# Patient Record
Sex: Male | Born: 1960 | Race: White | Hispanic: No | Marital: Married | Smoking: Never smoker
Health system: Southern US, Community
[De-identification: ages and names within clinical notes are randomized; demographics above are authoritative.]

## PROBLEM LIST (undated history)

## (undated) DIAGNOSIS — K859 Acute pancreatitis without necrosis or infection, unspecified: Secondary | ICD-10-CM

## (undated) DIAGNOSIS — C679 Malignant neoplasm of bladder, unspecified: Secondary | ICD-10-CM

## (undated) DIAGNOSIS — K760 Fatty (change of) liver, not elsewhere classified: Secondary | ICD-10-CM

## (undated) DIAGNOSIS — T4145XA Adverse effect of unspecified anesthetic, initial encounter: Secondary | ICD-10-CM

## (undated) DIAGNOSIS — F909 Attention-deficit hyperactivity disorder, unspecified type: Secondary | ICD-10-CM

## (undated) DIAGNOSIS — J45909 Unspecified asthma, uncomplicated: Secondary | ICD-10-CM

## (undated) DIAGNOSIS — G4730 Sleep apnea, unspecified: Secondary | ICD-10-CM

## (undated) DIAGNOSIS — K219 Gastro-esophageal reflux disease without esophagitis: Secondary | ICD-10-CM

## (undated) DIAGNOSIS — K8020 Calculus of gallbladder without cholecystitis without obstruction: Secondary | ICD-10-CM

## (undated) DIAGNOSIS — T8859XA Other complications of anesthesia, initial encounter: Secondary | ICD-10-CM

## (undated) DIAGNOSIS — E7800 Pure hypercholesterolemia, unspecified: Secondary | ICD-10-CM

## (undated) HISTORY — DX: Malignant neoplasm of bladder, unspecified: C67.9

## (undated) HISTORY — PX: COLONOSCOPY: SHX174

## (undated) HISTORY — PX: BLADDER SURGERY: SHX569

## (undated) HISTORY — DX: Unspecified asthma, uncomplicated: J45.909

---
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
## 1998-10-02 HISTORY — PX: HERNIA REPAIR: SHX51

## 1999-08-02 ENCOUNTER — Ambulatory Visit (HOSPITAL_BASED_OUTPATIENT_CLINIC_OR_DEPARTMENT_OTHER): Admission: RE | Admit: 1999-08-02 | Discharge: 1999-08-02 | Payer: Self-pay | Admitting: *Deleted

## 2012-04-25 ENCOUNTER — Other Ambulatory Visit: Payer: Self-pay | Admitting: Obstetrics and Gynecology

## 2012-04-25 DIAGNOSIS — R7989 Other specified abnormal findings of blood chemistry: Secondary | ICD-10-CM

## 2012-05-07 ENCOUNTER — Ambulatory Visit
Admission: RE | Admit: 2012-05-07 | Discharge: 2012-05-07 | Disposition: A | Discharge status: Home / Self Care | Payer: BC Managed Care – PPO | Source: Ambulatory Visit | Attending: Obstetrics and Gynecology | Admitting: Obstetrics and Gynecology

## 2012-05-07 DIAGNOSIS — R7989 Other specified abnormal findings of blood chemistry: Secondary | ICD-10-CM

## 2012-05-07 MED ORDER — GADOBENATE DIMEGLUMINE 529 MG/ML IV SOLN
10.0000 mL | Freq: Once | INTRAVENOUS | Status: AC | PRN
  Administered 2012-05-07: 10 mL via INTRAVENOUS

## 2013-12-08 ENCOUNTER — Other Ambulatory Visit: Payer: BC Managed Care – PPO

## 2013-12-08 ENCOUNTER — Other Ambulatory Visit: Payer: Self-pay | Admitting: Family Medicine

## 2013-12-08 DIAGNOSIS — R109 Unspecified abdominal pain: Secondary | ICD-10-CM

## 2013-12-11 ENCOUNTER — Ambulatory Visit
Admission: RE | Admit: 2013-12-11 | Discharge: 2013-12-11 | Disposition: A | Discharge status: Home / Self Care | Payer: BC Managed Care – PPO | Source: Ambulatory Visit | Attending: Family Medicine | Admitting: Family Medicine

## 2013-12-11 DIAGNOSIS — R109 Unspecified abdominal pain: Secondary | ICD-10-CM

## 2015-10-03 DIAGNOSIS — I2584 Coronary atherosclerosis due to calcified coronary lesion: Secondary | ICD-10-CM

## 2015-10-03 DIAGNOSIS — I2510 Atherosclerotic heart disease of native coronary artery without angina pectoris: Secondary | ICD-10-CM

## 2015-10-03 HISTORY — DX: Coronary atherosclerosis due to calcified coronary lesion: I25.84

## 2015-10-03 HISTORY — DX: Atherosclerotic heart disease of native coronary artery without angina pectoris: I25.10

## 2016-05-11 ENCOUNTER — Emergency Department (HOSPITAL_COMMUNITY)
Admission: EM | Admit: 2016-05-11 | Discharge: 2016-05-12 | Disposition: A | Discharge status: Home / Self Care | Payer: BC Managed Care – PPO | Attending: Emergency Medicine | Admitting: Emergency Medicine

## 2016-05-11 ENCOUNTER — Encounter (HOSPITAL_COMMUNITY): Payer: Self-pay | Admitting: Emergency Medicine

## 2016-05-11 DIAGNOSIS — R319 Hematuria, unspecified: Secondary | ICD-10-CM

## 2016-05-11 DIAGNOSIS — Z79899 Other long term (current) drug therapy: Secondary | ICD-10-CM | POA: Insufficient documentation

## 2016-05-11 DIAGNOSIS — M545 Low back pain: Secondary | ICD-10-CM | POA: Diagnosis present

## 2016-05-11 HISTORY — DX: Sleep apnea, unspecified: G47.30

## 2016-05-11 HISTORY — DX: Pure hypercholesterolemia, unspecified: E78.00

## 2016-05-11 NOTE — ED Triage Notes (Signed)
Pt states about 2130 had a dark coca cola colored urination. Pt c/o back ache x 1 week. Denies pain in thighs or gluteus. Denies pain with urination .

## 2016-05-12 LAB — URINALYSIS, ROUTINE W REFLEX MICROSCOPIC
BILIRUBIN URINE: NEGATIVE
GLUCOSE, UA: NEGATIVE mg/dL
Ketones, ur: NEGATIVE mg/dL
Leukocytes, UA: NEGATIVE
Nitrite: NEGATIVE
PH: 5.5 (ref 5.0–8.0)
Protein, ur: NEGATIVE mg/dL
SPECIFIC GRAVITY, URINE: 1.011 (ref 1.005–1.030)

## 2016-05-12 LAB — URINE MICROSCOPIC-ADD ON: WBC, UA: NONE SEEN WBC/hpf (ref 0–5)

## 2016-05-12 NOTE — Discharge Instructions (Signed)
It is possible that your symptoms today may be due to a large kidney stone. This would also explain the sudden onset of your back pain which has lingered over the past few days. You have no evidence of bilirubin in your urine to suggest any liver problems. There is also no protein in your urine to suggest muscle breakdown or rhabdomyolysis. We recommend that you follow-up with your primary care doctor for blood work, if desired. Your primary doctor may also order an outpatient CT scan to evaluate for a potential kidney stone. Return to the emergency department for any new or concerning symptoms. You may take 500mg  tylenol or 600mg  ibuprofen every 6 hours for pain control, as needed.

## 2016-05-12 NOTE — ED Notes (Signed)
Pt requested to see PA.  PA aware.

## 2016-05-12 NOTE — ED Notes (Signed)
Pt ambulatory and independent at discharge.  Verbalized understanding of discharge instructions 

## 2016-05-12 NOTE — ED Notes (Signed)
Spoke with patient and wife about wait time

## 2016-05-12 NOTE — ED Provider Notes (Signed)
[facility] DEPT Provider Note   CSN: [id number] Arrival date & time: 05/11/16  2247  First Provider Contact:  None       History   Chief Complaint Chief Complaint  Patient presents with  . dark urination    HPI Sean Snow is a 55 y.o. male.  55 year old male with a history of dyslipidemia and sleep apnea presents to the emergency department for one episode of voiding urine of a darker color. Patient states that his urine appeared the same color as "Coca-Cola" this evening. Prior to this void, patient reports normal color to his urine. He states that he drank approximately 1.5 L of water following this episode and his subsequent void was, again, "straw-colored". Patient states that he is concerned about what may have caused this symptom. He does state that symptoms were preceded by low back pain. This began, initially, 6 days ago, waking the patient from sleep. He states that the pain has been constant and waxing and waning in severity. It was most severe on the first day. He denies a recent heavy lifting or other stress activity. No bowel or bladder incontinence or inability to ambulate. Patient has not had any significant nocturia or voiding difficulty. No hematuria that the patient has appreciated. No dysuria or fevers. Patient denies history of kidney stones. He has no known history of kidney stones in the family. Abdominal surgical history significant for a hernia repair.   The history is provided by the patient and the spouse. No language interpreter was used.    Past Medical History:  Diagnosis Date  . Hypercholesteremia   . Sleep apnea     There are no active problems to display for this patient.   Past Surgical History:  Procedure Laterality Date  . HERNIA REPAIR  2000   abdomen        Home Medications    Prior to Admission medications   Medication Sig Start Date End Date Taking? Authorizing Provider  acetaminophen (TYLENOL) 500 MG tablet Take 500 mg by  mouth every 6 (six) hours as needed for mild pain.   Yes Historical Provider, MD  albuterol (PROVENTIL HFA;VENTOLIN HFA) 108 (90 Base) MCG/ACT inhaler Inhale 1-2 puffs into the lungs every 6 (six) hours as needed for wheezing or shortness of breath.   Yes Historical Provider, MD  atorvastatin (LIPITOR) 10 MG tablet Take 1 tablet by mouth daily. 04/30/16  Yes Historical Provider, MD  ezetimibe (ZETIA) 10 MG tablet Take 1 tablet by mouth daily. 04/30/16  Yes Historical Provider, MD  ipratropium (ATROVENT) 0.06 % nasal spray Place 2 sprays into both nostrils daily as needed for rhinitis.   Yes Historical Provider, MD  MELATONIN PO Take 1 tablet by mouth at bedtime as needed (sleep).   Yes Historical Provider, MD  methylphenidate (RITALIN) 20 MG tablet Take 1 tablet by mouth 3 (three) times daily. 04/30/16  Yes Historical Provider, MD  montelukast (SINGULAIR) 10 MG tablet Take 1 tablet by mouth daily. 04/22/16  Yes Historical Provider, MD  Omega-3 Fatty Acids (FISH OIL PO) Take 1 tablet by mouth daily.   Yes Historical Provider, MD  testosterone cypionate (DEPOTESTOTERONE CYPIONATE) 100 MG/ML injection Inject 50 mg into the muscle every 7 (seven) days. For IM use only   Yes Historical Provider, MD  triamcinolone (NASACORT ALLERGY 24HR) 55 MCG/ACT AERO nasal inhaler Place 2 sprays into the nose daily.   Yes Historical Provider, MD    Family History No family history on file.  Social History  Social History  Substance Use Topics  . Smoking status: Never Smoker  . Smokeless tobacco: Never Used  . Alcohol use No     Allergies   Aspirin; Nsaids; Penicillins; and Sulfa antibiotics   Review of Systems Review of Systems  Constitutional: Negative for fever.  Gastrointestinal: Negative for nausea and vomiting.  Genitourinary: Negative for difficulty urinating and dysuria.       +dark urine -bowel/bladder incontinence  Musculoskeletal: Positive for back pain.  Ten systems reviewed and are negative  for acute change, except as noted in the HPI.    Physical Exam Updated Vital Signs BP 122/90 (BP Location: Left Arm)   Pulse 60   Temp 97.8 F (36.6 C)   Resp 18   SpO2 95%   Physical Exam  Constitutional: He is oriented to person, place, and time. He appears well-developed and well-nourished. No distress.  Nontoxic appearing. Patient in no distress.  HENT:  Head: Normocephalic and atraumatic.  Eyes: Conjunctivae and EOM are normal. No scleral icterus.  Neck: Normal range of motion.  Cardiovascular: Normal rate, regular rhythm and intact distal pulses.   Pulmonary/Chest: Effort normal. No respiratory distress. He has no wheezes. He has no rales.  Respirations even and unlabored.  Abdominal: Soft. He exhibits no distension and no mass. There is tenderness. There is no rebound.    Mild tenderness to palpation in the right lower abdomen. No masses, guarding, or peritoneal signs. No CVA tenderness bilaterally.  Musculoskeletal: Normal range of motion.  Neurological: He is alert and oriented to person, place, and time.  GCS 15. Patient ambulatory with steady gait.  Skin: Skin is warm and dry. No rash noted. He is not diaphoretic. No erythema. No pallor.  Psychiatric: He has a normal mood and affect. He is agitated.  Nursing note and vitals reviewed.    ED Treatments / Results  Labs (all labs ordered are listed, but only abnormal results are displayed) Labs Reviewed  URINALYSIS, ROUTINE W REFLEX MICROSCOPIC (NOT AT [facility]) - Abnormal; Notable for the following:       Result Value   APPearance CLOUDY (*)    Hgb urine dipstick LARGE (*)    All other components within normal limits  URINE MICROSCOPIC-ADD ON - Abnormal; Notable for the following:    Squamous Epithelial / LPF 0-5 (*)    Bacteria, UA RARE (*)    All other components within normal limits    EKG  EKG Interpretation None       Radiology No results found.  Procedures Procedures (including critical care  time)  Medications Ordered in ED Medications - No data to display   Initial Impression / Assessment and Plan / ED Course  I have reviewed the triage vital signs and the nursing notes.  Pertinent labs & imaging results that were available during my care of the patient were reviewed by me and considered in my medical decision making (see chart for details).  Clinical Course    55 year old male presents to the emergency department for changes to his urine color. This happened for 1 episode and resolved after oral fluid hydration. Patient has continued to void without difficulty. Symptoms have been preceded by 6 days of low back pain which were sudden in onset, waking the patient from sleep. Urinalysis performed which shows microscopic hematuria. In the setting of back pain, this raises the question for potential kidney stones. Patient is also concerned about potential liver etiology or rhabdomyolysis. I have reassured the patient that he  has no urobilinogen today to suggest any liver issues. He has no proteinuria to suggest rhabdomyolysis. Patient further denies being outdoors frequently or in any other setting which may have promoted dehydration.  Due to the unfortunate wait times, patient was very irritated by the time I evaluated him. I did offer to have labs performed as well as a CT scan to evaluate for a potential kidney stone. Patient declines further laboratory workup or imaging at this time as he states he can have this performed by his primary care doctor. Given that his pain seems fairly well controlled and his voiding trouble has resolved, I do not believe this is unreasonable. He exhibits no red flags or signs concerning for cauda equina. Patient has been instructed to return to the emergency department for worsening symptoms. Return precautions discussed and provided. Patient discharged in satisfactory condition with no unaddressed concerns.   Final Clinical Impressions(s) / ED  Diagnoses   Final diagnoses:  Hematuria  Low back pain without sciatica, unspecified back pain laterality    New Prescriptions Discharge Medication List as of 05/12/2016  3:54 AM       Antonietta Breach, PA-C 05/12/16 Home, MD 05/12/16 [phone]

## 2016-06-02 ENCOUNTER — Other Ambulatory Visit (HOSPITAL_COMMUNITY): Payer: Self-pay | Admitting: Internal Medicine

## 2016-06-02 ENCOUNTER — Ambulatory Visit (HOSPITAL_COMMUNITY)
Admission: RE | Admit: 2016-06-02 | Discharge: 2016-06-02 | Disposition: A | Discharge status: Home / Self Care | Payer: BC Managed Care – PPO | Source: Ambulatory Visit | Attending: Internal Medicine | Admitting: Internal Medicine

## 2016-06-02 DIAGNOSIS — N200 Calculus of kidney: Secondary | ICD-10-CM

## 2016-06-02 DIAGNOSIS — Z09 Encounter for follow-up examination after completed treatment for conditions other than malignant neoplasm: Secondary | ICD-10-CM | POA: Insufficient documentation

## 2016-06-02 DIAGNOSIS — I2510 Atherosclerotic heart disease of native coronary artery without angina pectoris: Secondary | ICD-10-CM | POA: Insufficient documentation

## 2016-06-02 DIAGNOSIS — I700 Atherosclerosis of aorta: Secondary | ICD-10-CM | POA: Diagnosis not present

## 2016-06-02 DIAGNOSIS — R9349 Abnormal radiologic findings on diagnostic imaging of other urinary organs: Secondary | ICD-10-CM | POA: Diagnosis not present

## 2016-06-02 DIAGNOSIS — K4090 Unilateral inguinal hernia, without obstruction or gangrene, not specified as recurrent: Secondary | ICD-10-CM | POA: Diagnosis not present

## 2016-06-02 DIAGNOSIS — Z87442 Personal history of urinary calculi: Secondary | ICD-10-CM | POA: Diagnosis not present

## 2016-08-02 NOTE — Progress Notes (Signed)
Cardiology Office Note   Date:  08/03/2016   ID:  Sean Snow, DOB 1960/11/26, MRN [id number]  PCP:  No PCP Per Patient  Cardiologist:   Minus Breeding, MD  Referring:  Self  Chief Complaint  Patient presents with  . Coronary Calcium      History of Present Illness: Sean Snow is a 55 y.o. male who presents for new patient evaluation. He's recently been found to have bladder cancer. As part of workup he has had CTs. He was found on a chest CT to have coronary calcium in all 3 vessels including the left main. However, he's never had any cardiovascular symptoms. I did take care of his father who had heart disease starting in his 58s and had an ischemic cardiomyopathy with an ICD. The patient is active. He exercises routinely.  The patient denies any new symptoms such as chest discomfort, neck or arm discomfort. There has been no new shortness of breath, PND or orthopnea. There have been no reported palpitations, presyncope or syncope.  He has had transurethral surgery for bladder cancer and is going to have this again with ongoing therapy.   Past Medical History:  Diagnosis Date  . Asthma    Exercise induced  . Bladder cancer ([place])   . Hypercholesteremia   . Sleep apnea    CPAP    Past Surgical History:  Procedure Laterality Date  . BLADDER SURGERY    . HERNIA REPAIR  2000   abdomen      Current Outpatient Prescriptions  Medication Sig Dispense Refill  . acetaminophen (TYLENOL) 500 MG tablet Take 500 mg by mouth every 6 (six) hours as needed for mild pain.    Marland Kitchen albuterol (PROVENTIL HFA;VENTOLIN HFA) 108 (90 Base) MCG/ACT inhaler Inhale 1-2 puffs into the lungs every 6 (six) hours as needed for wheezing or shortness of breath.    Marland Kitchen atorvastatin (LIPITOR) 10 MG tablet Take 1 tablet by mouth daily.    Marland Kitchen ezetimibe (ZETIA) 10 MG tablet Take 1 tablet by mouth daily.    Marland Kitchen ipratropium (ATROVENT) 0.06 % nasal spray Place 2 sprays into both nostrils daily as needed for  rhinitis.    Marland Kitchen MELATONIN PO Take 1 tablet by mouth at bedtime as needed (sleep).    . methylphenidate (RITALIN) 20 MG tablet Take 1 tablet by mouth 3 (three) times daily.    . montelukast (SINGULAIR) 10 MG tablet Take 1 tablet by mouth daily.    . Omega-3 Fatty Acids (FISH OIL PO) Take 1 tablet by mouth daily.    . propranolol (INDERAL) 20 MG tablet Take 1 tablet by mouth 3 (three) times daily as needed. For anxiety    . tamsulosin (FLOMAX) 0.4 MG CAPS capsule Take 0.4 mg by mouth 2 (two) times daily.    Marland Kitchen testosterone cypionate (DEPOTESTOTERONE CYPIONATE) 100 MG/ML injection Inject 50 mg into the muscle every 7 (seven) days. For IM use only    . triamcinolone (NASACORT ALLERGY 24HR) 55 MCG/ACT AERO nasal inhaler Place 2 sprays into the nose daily.     No current facility-administered medications for this visit.     Allergies:   Aspirin; Nsaids; Penicillins; and Sulfa antibiotics    Social History:  The patient  reports that he has never smoked. He has never used smokeless tobacco. He reports that he does not drink alcohol.   Family History:  The patient's family history includes Heart attack (age of onset: 71) in his father; Hyperlipidemia in his mother.  ROS:  Please see the history of present illness.   Otherwise, review of systems are positive for none.   All other systems are reviewed and negative.    PHYSICAL EXAM: VS:  BP 106/84   Pulse 75   Ht 5\' 10"  (1.778 m)   Wt 204 lb (92.5 kg)   BMI 29.27 kg/m  , BMI Body mass index is 29.27 kg/m. GENERAL:  Well appearing HEENT:  Pupils equal round and reactive, fundi not visualized, oral mucosa unremarkable NECK:  No jugular venous distention, waveform within normal limits, carotid upstroke brisk and symmetric, no bruits, no thyromegaly LYMPHATICS:  No cervical, inguinal adenopathy LUNGS:  Clear to auscultation bilaterally BACK:  No CVA tenderness CHEST:  Unremarkable HEART:  PMI not displaced or sustained,S1 and S2 within  normal limits, no S3, no S4, no clicks, no rubs, no murmurs ABD:  Flat, positive bowel sounds normal in frequency in pitch, no bruits, no rebound, no guarding, no midline pulsatile mass, no hepatomegaly, no splenomegaly EXT:  2 plus pulses throughout, no edema, no cyanosis no clubbing SKIN:  No rashes no nodules NEURO:  Cranial nerves II through XII grossly intact, motor grossly intact throughout PSYCH:  Cognitively intact, oriented to person place and time    EKG:  EKG is ordered today. The ekg ordered today demonstrates sinus rhythm, rate 75, axis within normal limits, intervals within normal limits, poor anterior R wave progression, no acute ST-T wave changes   Recent Labs: No results found for requested labs within last 8760 hours.    Lipid Panel No results found for: CHOL, TRIG, HDL, CHOLHDL, VLDL, LDLCALC, LDLDIRECT    Wt Readings from Last 3 Encounters:  08/03/16 204 lb (92.5 kg)      Other studies Reviewed: Additional studies/ records that were reviewed today include: CTs and [facility] records. Review of the above records demonstrates:  Please see elsewhere in the note.     ASSESSMENT AND PLAN:  CORONARY CALCIUM:  I will bring the patient back for a POET (Plain Old Exercise Test). This will allow me to screen for obstructive coronary disease, risk stratify and very importantly provide a prescription for exercise.  He has absolutely no symptoms and a very high functional level.  DYSLIPIDEMIA:   I have suggested moderate level statin which he was on before. He will go back to Crestor 20 mg. The goal should be an LDL of 70 given his family history calcification.   Current medicines are reviewed at length with the patient today.  The patient does not have concerns regarding medicines.  The following changes have been made:  As above  Labs/ tests ordered today include: POET (Plain Old Exercise Treadmill)  Orders Placed This Encounter  Procedures  . EXERCISE TOLERANCE TEST    . EKG 12-Lead     Disposition:   FU with me in one year.     Signed, Minus Breeding, MD  08/03/2016 11:08 AM    [place]

## 2016-08-03 ENCOUNTER — Ambulatory Visit (INDEPENDENT_AMBULATORY_CARE_PROVIDER_SITE_OTHER): Payer: BC Managed Care – PPO | Admitting: Cardiology

## 2016-08-03 ENCOUNTER — Encounter: Payer: Self-pay | Admitting: Cardiology

## 2016-08-03 ENCOUNTER — Encounter (INDEPENDENT_AMBULATORY_CARE_PROVIDER_SITE_OTHER): Payer: Self-pay

## 2016-08-03 VITALS — BP 106/84 | HR 75 | Ht 70.0 in | Wt 204.0 lb

## 2016-08-03 DIAGNOSIS — R0602 Shortness of breath: Secondary | ICD-10-CM

## 2016-08-03 DIAGNOSIS — R931 Abnormal findings on diagnostic imaging of heart and coronary circulation: Secondary | ICD-10-CM | POA: Diagnosis not present

## 2016-08-03 DIAGNOSIS — E785 Hyperlipidemia, unspecified: Secondary | ICD-10-CM

## 2016-08-03 NOTE — Patient Instructions (Signed)
Medication Instructions:  Continue current medication  Labwork: None Ordered  Testing/Procedures: Your physician has requested that you have an exercise tolerance test. For further information please visit [email or web address]. Please also follow instruction sheet, as given.   Follow-Up: Your physician wants you to follow-up in: 1 Year. You will receive a reminder letter in the mail two months in advance. If you don't receive a letter, please call our office to schedule the follow-up appointment.   Any Other Special Instructions Will Be Listed Below (If Applicable).     If you need a refill on your cardiac medications before your next appointment, please call your pharmacy.

## 2016-08-07 ENCOUNTER — Telehealth: Payer: Self-pay | Admitting: *Deleted

## 2016-08-07 MED ORDER — ROSUVASTATIN CALCIUM 20 MG PO TABS: 20.0000 mg | ORAL_TABLET | Freq: Every day | ORAL | 3 refills | Status: DC

## 2016-08-07 NOTE — Telephone Encounter (Signed)
OK per DPR to leave detailed msg. Call goes to VM, left instructions regarding med change, acknowledgment that it was sent to pharmacy, and advised to call if questions.

## 2016-08-07 NOTE — Telephone Encounter (Signed)
Patient called and stated that he saw Dr Percival Spanish last week and he was instructed to take crestor instead of lipitor, but an rx was not sent in. He would like this to be sent to [facility]. Thanks, MI

## 2016-08-31 ENCOUNTER — Ambulatory Visit: Payer: BC Managed Care – PPO | Admitting: Cardiology

## 2016-09-13 ENCOUNTER — Telehealth: Payer: Self-pay | Admitting: Cardiology

## 2016-09-13 DIAGNOSIS — E785 Hyperlipidemia, unspecified: Secondary | ICD-10-CM | POA: Insufficient documentation

## 2016-09-13 DIAGNOSIS — R931 Abnormal findings on diagnostic imaging of heart and coronary circulation: Secondary | ICD-10-CM | POA: Insufficient documentation

## 2016-09-13 DIAGNOSIS — R0602 Shortness of breath: Secondary | ICD-10-CM

## 2016-09-13 NOTE — Telephone Encounter (Signed)
Spoke to patient will have scheduler contact him on schedule GXT.

## 2016-09-13 NOTE — Telephone Encounter (Signed)
NEW MESSAGE    Pt verbalized that he received a call to schedule the Treadmill test but there is no order in the system  He had to finish with cancer treatments and test before he could have it done which is why he didn't schedule it before  I instructed pt that I will send RN a message the dates that he is looking for is 1/22-1/26 anytime after 8:30am   Please call pt

## 2016-09-14 ENCOUNTER — Encounter: Payer: Self-pay | Admitting: Cardiology

## 2016-10-20 ENCOUNTER — Telehealth (HOSPITAL_COMMUNITY): Payer: Self-pay

## 2016-10-20 NOTE — Telephone Encounter (Signed)
Encounter complete. 

## 2016-10-24 ENCOUNTER — Ambulatory Visit (HOSPITAL_COMMUNITY)
Admission: RE | Admit: 2016-10-24 | Discharge: 2016-10-24 | Disposition: A | Discharge status: Home / Self Care | Payer: BC Managed Care – PPO | Source: Ambulatory Visit | Attending: Cardiovascular Disease | Admitting: Cardiovascular Disease

## 2016-10-24 DIAGNOSIS — R0602 Shortness of breath: Secondary | ICD-10-CM

## 2016-10-24 DIAGNOSIS — E785 Hyperlipidemia, unspecified: Secondary | ICD-10-CM

## 2016-10-24 DIAGNOSIS — R931 Abnormal findings on diagnostic imaging of heart and coronary circulation: Secondary | ICD-10-CM

## 2016-10-24 LAB — EXERCISE TOLERANCE TEST
CHL CUP RESTING HR STRESS: 86 {beats}/min
CHL RATE OF PERCEIVED EXERTION: 17
CSEPED: 9 min
CSEPEW: 10.3 METS
CSEPPHR: 157 {beats}/min
Exercise duration (sec): 9 s
MPHR: 166 {beats}/min
Percent HR: 94 %

## 2017-09-05 ENCOUNTER — Other Ambulatory Visit: Payer: Self-pay | Admitting: Cardiology

## 2018-01-21 ENCOUNTER — Other Ambulatory Visit: Payer: Self-pay | Admitting: Cardiology

## 2018-01-22 NOTE — Telephone Encounter (Signed)
REFILL 

## 2018-02-26 ENCOUNTER — Encounter: Payer: Self-pay | Admitting: Physician Assistant

## 2018-02-26 ENCOUNTER — Ambulatory Visit: Payer: BC Managed Care – PPO | Admitting: Physician Assistant

## 2018-02-26 VITALS — BP 130/84 | HR 90 | Ht 70.0 in | Wt 210.6 lb

## 2018-02-26 DIAGNOSIS — E785 Hyperlipidemia, unspecified: Secondary | ICD-10-CM

## 2018-02-26 DIAGNOSIS — R931 Abnormal findings on diagnostic imaging of heart and coronary circulation: Secondary | ICD-10-CM | POA: Diagnosis not present

## 2018-02-26 DIAGNOSIS — R0602 Shortness of breath: Secondary | ICD-10-CM

## 2018-02-26 MED ORDER — ROSUVASTATIN CALCIUM 20 MG PO TABS: 20.0000 mg | ORAL_TABLET | Freq: Every day | ORAL | 4 refills | Status: DC

## 2018-02-26 NOTE — Progress Notes (Signed)
Cardiology Office Note   Date:  02/26/2018   ID:  Sean Snow, DOB 1961-05-30, MRN [id number]  PCP:  Deland Pretty, MD  Cardiologist: Dr. Percival Spanish, 08/03/2016 Rosaria Ferries, PA-C   Chief Complaint  Patient presents with  . Follow-up  . Shortness of Breath  . Chest Pain    History of Present Illness: Sean Snow is a 57 y.o. male with a history of bladder cancer, coronary calcification, FH CAD in father (46s) who had ICM/ICD. 08/2016 Office visit>>POET>>[facility] treadmill score 9, ECG no change  Sean Snow presents for cardiology follow up.  He has had surgery for the bladder CA. He did immunotherapy after the surgery and is now on maintenance therapy with the BCG. Biopsies have since been negative.   He was extremely deconditioned, but has started using a home treadmill. He feels he is starting to get his strength back. He is able to do 25" at 2.5 mph and was able to do an additional 45" later.  He feels his dyspnea on exertion is in line with his level of exertion.  He never gets chest pain with exertion.  He denies lower extremity edema, orthopnea, or PND.  He never gets palpitations.  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 He still takes the propranolol prn.  He has had chest pain and bilateral shoulder/arm pain. He does not get these sx with exertion, they are brought on by stress.  They are improved by him getting on the treadmill. He thinks the shoulder pain comes from him needing bilateral shoulder surgery.    Past Medical History:  Diagnosis Date  . Asthma    Exercise induced  . Bladder cancer ([place])   . Hypercholesteremia   . Sleep apnea    CPAP    Past Surgical History:  Procedure Laterality Date  . BLADDER SURGERY    . HERNIA REPAIR  2000   abdomen     Current Outpatient Medications  Medication Sig Dispense Refill  . acetaminophen (TYLENOL) 500 MG tablet Take 650 mg by mouth every 6 (six) hours as needed for mild pain.     Marland Kitchen albuterol (PROVENTIL  HFA;VENTOLIN HFA) 108 (90 Base) MCG/ACT inhaler Inhale 1-2 puffs into the lungs every 6 (six) hours as needed for wheezing or shortness of breath.    Marland Kitchen ipratropium (ATROVENT) 0.06 % nasal spray Place 2 sprays into both nostrils daily as needed for rhinitis.    Marland Kitchen MELATONIN PO Take 1 tablet by mouth at bedtime as needed (sleep).    . methylphenidate (RITALIN) 20 MG tablet Take 1 tablet by mouth 3 (three) times daily.    . NON FORMULARY CPAP    . Omega-3 Fatty Acids (FISH OIL PO) Take 1 tablet by mouth daily.    . propranolol (INDERAL) 20 MG tablet Take 1 tablet by mouth 3 (three) times daily as needed. For anxiety    . rosuvastatin (CRESTOR) 20 MG tablet Take 1 tablet (20 mg total) by mouth daily. KEEP OV. 90 tablet 0  . tamsulosin (FLOMAX) 0.4 MG CAPS capsule Take 0.4 mg by mouth 2 (two) times daily as needed.     . testosterone cypionate (DEPOTESTOTERONE CYPIONATE) 100 MG/ML injection Inject 50 mg into the muscle every 7 (seven) days. For IM use only    . triamcinolone (NASACORT ALLERGY 24HR) 55 MCG/ACT AERO nasal inhaler Place 2 sprays into the nose daily.     No current facility-administered medications for this visit.     Allergies:  Aspirin; Nsaids; Other; Penicillins; Tartrazine; Sulfamethoxazole; and Sulfa antibiotics    Social History:  The patient  reports that he has never smoked. He has never used smokeless tobacco. He reports that he does not drink alcohol.   Family History:  The patient's family history includes Heart attack (age of onset: 50) in his father; Hyperlipidemia in his mother.    ROS:  Please see the history of present illness. All other systems are reviewed and negative.    PHYSICAL EXAM: VS:  BP 130/84   Pulse 90   Ht 5\' 10"  (1.778 m)   Wt 210 lb 9.6 oz (95.5 kg)   BMI 30.22 kg/m  , BMI Body mass index is 30.22 kg/m. GEN: Well nourished, well developed, male in no acute distress  HEENT: normal for age  Neck: no JVD, no carotid bruit, no masses Cardiac:  RRR; no murmur, no rubs, or gallops Respiratory:  clear to auscultation bilaterally, normal work of breathing GI: soft, nontender, nondistended, + BS MS: no deformity or atrophy; no edema; distal pulses are 2+ in all 4 extremities   Skin: warm and dry, no rash Neuro:  Strength and sensation are intact Psych: euthymic mood, full affect   EKG:  EKG is ordered today. The ekg ordered today demonstrates SR, HR 90, no acute ischemic changes - no sig change from 2017   Recent Labs: No results found for requested labs within last 8760 hours.    Lipid Panel No results found for: CHOL, TRIG, HDL, CHOLHDL, VLDL, LDLCALC, LDLDIRECT   Wt Readings from Last 3 Encounters:  02/26/18 210 lb 9.6 oz (95.5 kg)  08/03/16 204 lb (92.5 kg)     Other studies Reviewed: Additional studies/ records that were reviewed today include: Office notes, hospital records and testing.  ASSESSMENT AND PLAN:  1.  Elevated coronary calcium score, family history of coronary artery disease: He is working to control his cardiac risk factors.  He is not over abusing caffeine, he does not smoke or abuse alcohol.  He is starting to exercise again, he has been a regular exerciser all of his life.  He is not having any ischemic symptoms.  Because of these issues, we will continue to follow him yearly, but do not need to do any additional testing at this time.  2.  Hyperlipidemia: Because of the coronary calcifications, his goal LDL is less than 70. PCP is following his labs, we will obtain copies.  Continue Crestor.   Current medicines are reviewed at length with the patient today.  The patient has concerns regarding medicines.  Concerns were addressed  The following changes have been made:  no change  Labs/ tests ordered today include:  No orders of the defined types were placed in this encounter.    Disposition:   FU with Dr. Percival Spanish  Signed, Rosaria Ferries, PA-C  02/26/2018 1:51 PM    [place] Phone: [phone]; Fax: [phone]  This note was written with the assistance of speech recognition software. Please excuse any transcriptional errors.

## 2018-02-26 NOTE — Patient Instructions (Signed)
Medication Instructions: Your physician recommends that you continue on your current medications as directed. Please refer to the Current Medication list given to you today.  If you need a refill on your cardiac medications before your next appointment, please call your pharmacy.    Follow-Up: Your physician wants you to follow-up in 1 year with Dr. Allena Napoleon will receive a reminder letter in the mail two months in advance. If you don't receive a letter, please call our office at [phone] to schedule this follow-up appointment.   Special Instructions: Continue with exercise regimen.   Thank you for choosing [facility] at [facility]!!

## 2018-05-23 ENCOUNTER — Encounter: Payer: Self-pay | Admitting: Internal Medicine

## 2018-11-09 ENCOUNTER — Emergency Department
Admission: EM | Admit: 2018-11-09 | Discharge: 2018-11-09 | Disposition: A | Discharge status: Home / Self Care | Payer: BC Managed Care – PPO | Source: Home / Self Care

## 2018-11-09 ENCOUNTER — Emergency Department (INDEPENDENT_AMBULATORY_CARE_PROVIDER_SITE_OTHER): Payer: BC Managed Care – PPO

## 2018-11-09 ENCOUNTER — Encounter: Payer: Self-pay | Admitting: Emergency Medicine

## 2018-11-09 DIAGNOSIS — S29012A Strain of muscle and tendon of back wall of thorax, initial encounter: Secondary | ICD-10-CM

## 2018-11-09 DIAGNOSIS — M549 Dorsalgia, unspecified: Secondary | ICD-10-CM

## 2018-11-09 DIAGNOSIS — M79602 Pain in left arm: Secondary | ICD-10-CM | POA: Diagnosis not present

## 2018-11-09 DIAGNOSIS — M25512 Pain in left shoulder: Secondary | ICD-10-CM | POA: Diagnosis not present

## 2018-11-09 MED ORDER — METAXALONE 800 MG PO TABS: 800.0000 mg | ORAL_TABLET | Freq: Three times a day (TID) | ORAL | 0 refills | Status: DC | PRN

## 2018-11-09 MED ORDER — PREDNISONE 50 MG PO TABS: 50.0000 mg | ORAL_TABLET | Freq: Every day | ORAL | 0 refills | Status: AC

## 2018-11-09 NOTE — Discharge Instructions (Signed)
°  Metaxalone (Skelaxin) is a muscle relaxer and may cause drowsiness. Do not drink alcohol, drive, or operate heavy machinery while taking.  Please follow up with your family doctor later this week if not improving.

## 2018-11-09 NOTE — ED Provider Notes (Signed)
[facility]    CSN: [id number] Arrival date & time: 11/09/18  1421     History   Chief Complaint Chief Complaint  Patient presents with  . Shoulder Pain    HPI Sean Snow is a 58 y.o. male.   HPI  Sean Snow is a 58 y.o. male presenting to UC with c/o gradually worsening Left upper back pain that radiates down his Left arm. Difficult to get into a comfortable position. Pt was gradually slowing down on a treadmill when it suddenly went from 16mph to 8mph, causing his feet to fly back behind him and his trunk.shoulders to lunge forward while he grasped tightly to the handle bars.  He did not fall. Pain gradually worsening since, 10/10 at worse, 7/10 at best. He has taken acetaminophen and used ice and heat w/o relief. Hx of angioedema with ibuprofen so he cannot take NSAIDs.  No prior hx of back or shoulder problems. Pt does report hx of bladder cancer but no known hx of bone mets. No hx of osteoporosis. Pt concerned about a slipped disc and is requesting imaging today.    Past Medical History:  Diagnosis Date  . Asthma    Exercise induced  . Bladder cancer ([place])   . Hypercholesteremia   . Sleep apnea    CPAP    Patient Active Problem List   Diagnosis Date Noted  . SOB (shortness of breath) 09/13/2016  . Elevated coronary artery calcium score 09/13/2016  . Dyslipidemia 09/13/2016    Past Surgical History:  Procedure Laterality Date  . BLADDER SURGERY    . HERNIA REPAIR  2000   abdomen        Home Medications    Prior to Admission medications   Medication Sig Start Date End Date Taking? Authorizing Provider  acetaminophen (TYLENOL) 500 MG tablet Take 650 mg by mouth every 6 (six) hours as needed for mild pain.     [provider]  albuterol (PROVENTIL HFA;VENTOLIN HFA) 108 (90 Base) MCG/ACT inhaler Inhale 1-2 puffs into the lungs every 6 (six) hours as needed for wheezing or shortness of breath.    [provider]  ipratropium  (ATROVENT) 0.06 % nasal spray Place 2 sprays into both nostrils daily as needed for rhinitis.    [provider]  MELATONIN PO Take 1 tablet by mouth at bedtime as needed (sleep).    [provider]  metaxalone (SKELAXIN) 800 MG tablet Take 1 tablet (800 mg total) by mouth 3 (three) times daily as needed for muscle spasms. 11/09/18   [name], PA-C  methylphenidate (RITALIN) 20 MG tablet Take 1 tablet by mouth 3 (three) times daily. 04/30/16   [provider]  NON FORMULARY CPAP    [provider]  Omega-3 Fatty Acids (FISH OIL PO) Take 1 tablet by mouth daily.    [provider]  predniSONE (DELTASONE) 50 MG tablet Take 1 tablet (50 mg total) by mouth daily with breakfast for 3 days. 11/09/18 11/12/18  [name], PA-C  propranolol (INDERAL) 20 MG tablet Take 1 tablet by mouth 3 (three) times daily as needed. For anxiety 07/03/16   [provider]  rosuvastatin (CRESTOR) 20 MG tablet Take 1 tablet (20 mg total) by mouth daily. KEEP OV. 02/26/18   Barrett, [name], PA-C  tamsulosin (FLOMAX) 0.4 MG CAPS capsule Take 0.4 mg by mouth 2 (two) times daily as needed.     [provider]  testosterone cypionate (DEPOTESTOTERONE  CYPIONATE) 100 MG/ML injection Inject 50 mg into the muscle every 7 (seven) days. For IM use only    [provider]  triamcinolone (NASACORT ALLERGY 24HR) 55 MCG/ACT AERO nasal inhaler Place 2 sprays into the nose daily.    [provider]    Family History Family History  Problem Relation Age of Onset  . Hyperlipidemia Mother   . Heart attack Father 47    Social History Social History   Tobacco Use  . Smoking status: Never Smoker  . Smokeless tobacco: Never Used  Substance Use Topics  . Alcohol use: No  . Drug use: Not on file     Allergies   Aspirin; Nsaids; Other; Penicillins; Tartrazine; Sulfamethoxazole; and Sulfa antibiotics   Review of Systems Review of Systems    Musculoskeletal: Positive for arthralgias, back pain and myalgias.  Neurological: Negative for weakness and numbness.     Physical Exam Triage Vital Signs ED Triage Vitals  Enc Vitals Group     BP 11/09/18 1443 (!) 165/111     Pulse Rate 11/09/18 1443 67     Resp --      Temp 11/09/18 1443 97.8 F (36.6 C)     Temp Source 11/09/18 1443 Oral     SpO2 11/09/18 1443 97 %     Weight 11/09/18 1445 224 lb 4 oz (101.7 kg)     Height 11/09/18 1445 5\' 10"  (1.778 m)     Head Circumference --      Peak Flow --      Pain Score 11/09/18 1444 7     Pain Loc --      Pain Edu? --      Excl. in [place]? --    No data found.  Updated Vital Signs BP (!) 165/111 (BP Location: Right Arm)   Pulse 67   Temp 97.8 F (36.6 C) (Oral)   Ht 5\' 10"  (1.778 m)   Wt 224 lb 4 oz (101.7 kg)   SpO2 97%   BMI 32.18 kg/m   Visual Acuity Right Eye Distance:   Left Eye Distance:   Bilateral Distance:    Right Eye Near:   Left Eye Near:    Bilateral Near:     Physical Exam Vitals signs and nursing note reviewed.  Constitutional:      Appearance: Normal appearance. He is well-developed.     Comments: Pt standing in exam room with Left arm above his head, trying to get into comfortable position, pacing at times. Alert and cooperative during exam.  HENT:     Head: Normocephalic and atraumatic.  Neck:     Musculoskeletal: Normal range of motion and neck supple. No muscular tenderness.     Comments: No midline bone tenderness, no crepitus or step-offs.  Cardiovascular:     Rate and Rhythm: Normal rate.     Pulses:          Radial pulses are 2+ on the left side.  Pulmonary:     Effort: Pulmonary effort is normal.  Musculoskeletal: Normal range of motion.        General: Tenderness present.       Back:     Comments: Tenderness to mid thoracic spine across Left upper back muscles, Left upper arm and Left forearm.  Full ROM Left arm w/o crepitus. 5/5 grip strength.   Skin:    General: Skin is warm  and dry.     Capillary Refill: Capillary refill takes less than 2 seconds.  Neurological:     Mental Status: He is alert and oriented to person, place, and time.  Psychiatric:        Behavior: Behavior normal.      UC Treatments / Results  Labs (all labs ordered are listed, but only abnormal results are displayed) Labs Reviewed - No data to display  EKG None  Radiology Dg Thoracic Spine W/swimmers  Result Date: 11/09/2018 CLINICAL DATA:  Pt states he fell off his treadmill on Wednesday and since then he has been having upper back pain that radiates to his left shoulder. EXAM: THORACIC SPINE - 3 VIEWS COMPARISON:  None. FINDINGS: There is no evidence of thoracic spine fracture. Alignment is normal. Mild degenerative changes are seen in the LOWER cervical spine. IMPRESSION: No evidence for acute abnormality. Electronically Signed   By: Nolon Nations M.D.   On: 11/09/2018 15:42   Dg Shoulder Left  Result Date: 11/09/2018 CLINICAL DATA:  Pt states he fell off his treadmill on Wednesday and since then he has been having left shoulder pain that radiates down his left arm. EXAM: LEFT SHOULDER - 2+ VIEW COMPARISON:  None. FINDINGS: There is no evidence of fracture or dislocation. There is no evidence of arthropathy or other focal bone abnormality. Soft tissues are unremarkable. IMPRESSION: Negative. Electronically Signed   By: Nolon Nations M.D.   On: 11/09/2018 15:42    Procedures Procedures (including critical care time)  Medications Ordered in UC Medications - No data to display  Initial Impression / Assessment and Plan / UC Course  I have reviewed the triage vital signs and the nursing notes.  Pertinent labs & imaging results that were available during my care of the patient were reviewed by me and considered in my medical decision making (see chart for details).     Advised pt fracture is not likely, however, due to hx of bladder cancer it is still a possibility.  Discussed  limitations of plain films Hx and exam most c/w muscle strain Encouraged conservative tx  Pt has had muscle relaxers before but does not recall having Flexeril, per Epic there may be a cross-reactivity reaction with tartrazine- per medical records pt had anaphylaxis.   Pt has had prednisone and skelaxin before and has done well.  F/u with PCP or Sports Medicine, additional imaging may be indicated if conservative tx not helping.   Final Clinical Impressions(s) / UC Diagnoses   Final diagnoses:  Left shoulder pain  Upper back pain on left side  Muscle strain of left upper back, initial encounter  Left arm pain     Discharge Instructions      Metaxalone (Skelaxin) is a muscle relaxer and may cause drowsiness. Do not drink alcohol, drive, or operate heavy machinery while taking.  Please follow up with your family doctor later this week if not improving.     ED Prescriptions    Medication Sig Dispense Auth. Provider   predniSONE (DELTASONE) 50 MG tablet Take 1 tablet (50 mg total) by mouth daily with breakfast for 3 days. 3 tablet [name], PA-C   metaxalone (SKELAXIN) 800 MG tablet Take 1 tablet (800 mg total) by mouth 3 (three) times daily as needed for muscle spasms. 21 tablet [name], PA-C     Controlled Substance Prescriptions [place] Controlled Substance Registry consulted? Yes, I have consulted the [place] Controlled Substances Registry for this patient, and feel the risk/benefit ratio today is favorable for proceeding with this prescription for a controlled substance.  [name], [place] 11/09/18 1625

## 2018-11-09 NOTE — ED Triage Notes (Signed)
Patient c/o left sided shoulder, back and arm injury from treadmill fall on Wednesday.

## 2018-12-02 MED ORDER — ONDANSETRON HCL 4 MG/2ML IJ SOLN
INTRAMUSCULAR | Status: AC
  Filled 2018-12-02: qty 2

## 2018-12-02 MED ORDER — LIDOCAINE 2% (20 MG/ML) 5 ML SYRINGE
INTRAMUSCULAR | Status: AC
  Filled 2018-12-02: qty 5

## 2018-12-02 MED ORDER — SODIUM CHLORIDE (PF) 0.9 % IJ SOLN
INTRAMUSCULAR | Status: AC
  Filled 2018-12-02: qty 10

## 2018-12-02 MED ORDER — SUGAMMADEX SODIUM 500 MG/5ML IV SOLN
INTRAVENOUS | Status: AC
  Filled 2018-12-02: qty 5

## 2018-12-02 MED ORDER — PROPOFOL 500 MG/50ML IV EMUL
INTRAVENOUS | Status: AC
  Filled 2018-12-02: qty 50

## 2018-12-02 MED ORDER — SUFENTANIL CITRATE 50 MCG/ML IV SOLN
INTRAVENOUS | Status: AC
  Filled 2018-12-02: qty 1

## 2018-12-02 MED ORDER — EPHEDRINE 5 MG/ML INJ
INTRAVENOUS | Status: AC
  Filled 2018-12-02: qty 10

## 2018-12-02 MED ORDER — DEXAMETHASONE SODIUM PHOSPHATE 10 MG/ML IJ SOLN
INTRAMUSCULAR | Status: AC
  Filled 2018-12-02: qty 1

## 2018-12-02 MED ORDER — MIDAZOLAM HCL 2 MG/2ML IJ SOLN
INTRAMUSCULAR | Status: AC
  Filled 2018-12-02: qty 2

## 2018-12-02 MED ORDER — ROCURONIUM BROMIDE 50 MG/5ML IV SOSY
PREFILLED_SYRINGE | INTRAVENOUS | Status: AC
  Filled 2018-12-02: qty 5

## 2018-12-02 MED ORDER — SUCCINYLCHOLINE CHLORIDE 200 MG/10ML IV SOSY
PREFILLED_SYRINGE | INTRAVENOUS | Status: AC
  Filled 2018-12-02: qty 10

## 2018-12-02 MED ORDER — PHENYLEPHRINE 40 MCG/ML (10ML) SYRINGE FOR IV PUSH (FOR BLOOD PRESSURE SUPPORT)
PREFILLED_SYRINGE | INTRAVENOUS | Status: AC
  Filled 2018-12-02: qty 10

## 2019-05-04 ENCOUNTER — Other Ambulatory Visit: Payer: Self-pay | Admitting: Physician Assistant

## 2019-06-06 ENCOUNTER — Telehealth: Payer: Self-pay | Admitting: Cardiology

## 2019-06-06 MED ORDER — ROSUVASTATIN CALCIUM 20 MG PO TABS: 20.0000 mg | ORAL_TABLET | Freq: Every day | ORAL | 0 refills | Status: DC

## 2019-06-06 NOTE — Telephone Encounter (Signed)
Requested Prescriptions   Signed Prescriptions Disp Refills  . rosuvastatin (CRESTOR) 20 MG tablet 30 tablet 0    Sig: Take 1 tablet (20 mg total) by mouth daily.    Authorizing Provider: Lonn Georgia    Ordering User: Raelene Bott, Malaijah Houchen L

## 2019-06-06 NOTE — Telephone Encounter (Signed)
New message    *STAT* If patient is at the pharmacy, call can be transferred to refill team.   1. Which medications need to be refilled? (please list name of each medication and dose if known)   rosuvastatin (CRESTOR) 20 MG tablet     2. Which pharmacy/location (including street and city if local pharmacy) is medication to be sent to?[place], [place]  3. Do they need a 30 day or 90 day supply?[facility]

## 2019-06-17 ENCOUNTER — Ambulatory Visit: Payer: BC Managed Care – PPO | Admitting: Physician Assistant

## 2019-06-17 ENCOUNTER — Encounter: Payer: Self-pay | Admitting: Physician Assistant

## 2019-06-17 ENCOUNTER — Other Ambulatory Visit: Payer: Self-pay

## 2019-06-17 VITALS — BP 148/92 | HR 65 | Ht 70.0 in | Wt 238.0 lb

## 2019-06-17 DIAGNOSIS — R931 Abnormal findings on diagnostic imaging of heart and coronary circulation: Secondary | ICD-10-CM | POA: Diagnosis not present

## 2019-06-17 DIAGNOSIS — E785 Hyperlipidemia, unspecified: Secondary | ICD-10-CM | POA: Diagnosis not present

## 2019-06-17 MED ORDER — ROSUVASTATIN CALCIUM 20 MG PO TABS: 20.0000 mg | ORAL_TABLET | Freq: Every day | ORAL | 3 refills | Status: DC

## 2019-06-17 NOTE — Progress Notes (Signed)
Cardiology Office Note   Date:  06/17/2019   ID:  Sean Snow, DOB 28-Aug-1961, MRN [id number]  PCP:  Deland Pretty, MD Cardiologist:  Minus Breeding, MD 08/13/2016 Rosaria Ferries, PA-C 02/26/2018   History of Present Illness: Sean Snow is a 58 y.o. male with a history of recurrent bladder cancer, coronary calcification, FH CAD in father (54s) who had ICM/ICD. 08/2016 Office visit>>POET>>Duke treadmill score 9, ECG no change.   Sean Snow presents for cardiology follow up.  His bladder CA (2017), keeps coming back. He has had multiple surgeries. Just tx care to another specialist 09/04. Has had tumors removed 6 times and other areas biopsied. Had a granuloma felt 2nd BCG>>cannot have any more. One surgery was a week before procedures were canceled due to COVID.   In Feb, was on the treadmill when it shorted out and caused him to get upper body injury, neck and back. Took 3 months to improve. Gabapentin helped, nothing else did.   He has not been able to exercise because of his injuries. 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   His back and neck have improved to the point that he can be a little more active, but it has been slow. Has not yet fixed the treadmill because he cannot use his upper body well enough. Was able to do 4000 steps one day recently. No chest pain or SOB w/ exertion.   Still is getting referred pain into his lower back, which may be from his injuries, but he also gets when he has bladder tumors. Dislikes the gabapentin but it helps.   Does not wake with lower extremity edema.  No orthopnea or PND.    Past Medical History:  Diagnosis Date  . Asthma    Exercise induced  . Bladder cancer ([place])   . Hypercholesteremia   . Sleep apnea    CPAP    Past Surgical History:  Procedure Laterality Date  . BLADDER SURGERY    . HERNIA REPAIR  2000   abdomen     Current Outpatient Medications  Medication Sig Dispense Refill   . acetaminophen (TYLENOL) 500 MG tablet Take 650 mg by mouth every 6 (six) hours as needed for mild pain.     Marland Kitchen albuterol (PROVENTIL HFA;VENTOLIN HFA) 108 (90 Base) MCG/ACT inhaler Inhale 1-2 puffs into the lungs every 6 (six) hours as needed for wheezing or shortness of breath.    . ezetimibe (ZETIA) 10 MG tablet Take 1 tablet by mouth at bedtime.    . gabapentin (NEURONTIN) 600 MG tablet Take 1 tablet by mouth as directed.    Marland Kitchen ipratropium (ATROVENT) 0.06 % nasal spray Place 2 sprays into both nostrils daily as needed for rhinitis.    Marland Kitchen MELATONIN PO Take 1 tablet by mouth at bedtime as needed (sleep).    . metaxalone (SKELAXIN) 800 MG tablet Take 1 tablet (800 mg total) by mouth 3 (three) times daily as needed for muscle spasms. 21 tablet 0  . methylphenidate (RITALIN) 20 MG tablet Take 1 tablet by mouth 3 (three) times daily.    . NON FORMULARY CPAP    . Omega-3 Fatty Acids (FISH OIL PO) Take 1 tablet by mouth daily.    . propranolol (INDERAL) 20 MG tablet Take 1 tablet by mouth 3 (three) times daily as needed. For anxiety    . rosuvastatin (CRESTOR) 20 MG tablet Take 1 tablet (20 mg total) by mouth daily. 30 tablet 0  .  tamsulosin (FLOMAX) 0.4 MG CAPS capsule Take 0.4 mg by mouth 2 (two) times daily as needed.     . testosterone cypionate (DEPOTESTOTERONE CYPIONATE) 100 MG/ML injection Inject 50 mg into the muscle every 7 (seven) days. For IM use only    . triamcinolone (NASACORT ALLERGY 24HR) 55 MCG/ACT AERO nasal inhaler Place 2 sprays into the nose daily.     No current facility-administered medications for this visit.     Allergies:   Aspirin, Nsaids, Other, Penicillins, Tartrazine, Sulfamethoxazole, and Sulfa antibiotics    Social History:  The patient  reports that he has never smoked. He has never used smokeless tobacco. He reports that he does not drink alcohol.   Family History:  The patient's family history includes Heart attack (age of onset: 53) in his father; Hyperlipidemia  in his mother.  He indicated that his mother is alive. He indicated that his father is deceased.   ROS:  Please see the history of present illness. All other systems are reviewed and negative.    PHYSICAL EXAM: VS:  BP (!) 148/92   Pulse 65   Ht 5\' 10"  (1.778 m)   Wt 238 lb (108 kg)   BMI 34.15 kg/m  , BMI Body mass index is 34.15 kg/m. GEN: Well nourished, well developed, male in no acute distress HEENT: normal for age  Neck: no JVD, no carotid bruit, no masses Cardiac: RRR; no murmur, no rubs, or gallops Respiratory:  clear to auscultation bilaterally, normal work of breathing GI: soft, nontender, nondistended, + BS MS: no deformity or atrophy; no edema; distal pulses are 2+ in all 4 extremities  Skin: warm and dry, no rash Neuro:  Strength and sensation are intact Psych: euthymic mood, full affect   EKG:  EKG is ordered today. The ekg ordered today demonstrates sinus rhythm with artifact, heart rate 65, no acute ischemic changes, Q waves seen in 3 and aVF are old  ETT:  10/24/2016  Blood pressure demonstrated a normal response to exercise.  There was no ST segment deviation noted during stress.   ETT with normal exercise tolerance (9:09); no chest pain; no ST changes; normal BP response; negative adequate ETT; Duke treadmill score 9.   Recent Labs: No results found for requested labs within last 8760 hours.  CBC No results found for: WBC, RBC, HGB, HCT, PLT, MCV, MCH, MCHC, RDW, LYMPHSABS, MONOABS, EOSABS, BASOSABS No flowsheet data found.   Lipid Panel No results found for: CHOL, HDL, LDLCALC, LDLDIRECT, TRIG, CHOLHDL    Wt Readings from Last 3 Encounters:  06/17/19 238 lb (108 kg)  11/09/18 224 lb 4 oz (101.7 kg)  02/26/18 210 lb 9.6 oz (95.5 kg)     Other studies Reviewed: Additional studies/ records that were reviewed today include: Office notes, hospital records and testing  ASSESSMENT AND PLAN:  1.  Coronary calcifications: - Until his recent  injury and multiple procedures, his fitness level was very good. -He is not having any ischemic symptoms at all. - He is encouraged to increase his activity back to previous levels, continue to be aggressive with this. -Report any ischemic symptoms -At this time, no need for additional testing.  2. Hyperlipidemia: -With his strong family history for premature coronary artery disease, would like to keep his LDL less than 70 and his LDL greater than 39 - Dr. Shelia Media follows his labs, will obtain copies - Refill his rosuvastatin for 90 days with refills as requested   Current medicines are reviewed at  length with the patient today.  The patient has concerns regarding medicines.  Concerns were addressed  The following changes have been made:  no change  Labs/ tests ordered today include:  No orders of the defined types were placed in this encounter.    Disposition:   FU with Minus Breeding, MD  Signed, Rosaria Ferries, PA-C  06/17/2019 1:51 PM    [place] Phone: [phone]; Fax: [phone]

## 2019-06-17 NOTE — Patient Instructions (Signed)
Medication Instructions:  Your physician recommends that you continue on your current medications as directed. Please refer to the Current Medication list given to you today.  If you need a refill on your cardiac medications before your next appointment, please call your pharmacy.    Follow-Up: At [facility], you and your health needs are our priority.  As part of our continuing mission to provide you with exceptional heart care, we have created designated Provider Care Teams.  These Care Teams include your primary Cardiologist (physician) and Advanced Practice Providers (APPs -  Physician Assistants and Nurse Practitioners) who all work together to provide you with the care you need, when you need it. You will need a follow up appointment in 1 year.  Please call our office 2 months in advance to schedule this appointment.  You may see Minus Breeding, MD or one of the following Advanced Practice Providers on your designated Care Team:   Rosaria Ferries, PA-C . Jory Sims, DNP, ANP

## 2019-06-20 NOTE — Addendum Note (Signed)
Addended by: Diana Eves on: 06/20/2019 03:57 PM   Modules accepted: Orders

## 2019-10-08 ENCOUNTER — Other Ambulatory Visit: Payer: Self-pay | Admitting: Internal Medicine

## 2019-10-08 ENCOUNTER — Other Ambulatory Visit (HOSPITAL_COMMUNITY): Payer: Self-pay | Admitting: Internal Medicine

## 2019-10-08 ENCOUNTER — Other Ambulatory Visit: Payer: Self-pay

## 2019-10-08 ENCOUNTER — Ambulatory Visit (HOSPITAL_COMMUNITY)
Admission: RE | Admit: 2019-10-08 | Discharge: 2019-10-08 | Disposition: A | Discharge status: Home / Self Care | Payer: BC Managed Care – PPO | Source: Ambulatory Visit | Attending: Internal Medicine | Admitting: Internal Medicine

## 2019-10-08 DIAGNOSIS — R1031 Right lower quadrant pain: Secondary | ICD-10-CM | POA: Insufficient documentation

## 2019-10-08 MED ORDER — IOHEXOL 300 MG/ML  SOLN
100.0000 mL | Freq: Once | INTRAMUSCULAR | Status: AC | PRN
  Administered 2019-10-08: 100 mL via INTRAVENOUS

## 2020-02-09 ENCOUNTER — Other Ambulatory Visit: Payer: Self-pay | Admitting: Surgery

## 2020-02-18 NOTE — Pre-Procedure Instructions (Signed)
[place], [place] [address] Phone: [phone] Fax: [phone]      Your procedure is scheduled on June 1st, 2021 Tuesday.  Report to [facility] Main Entrance "A" at 5:30 A.M., and check in at the Admitting office.  Call this number if you have problems the morning of surgery:  [phone]  Call [phone] if you have any questions prior to your surgery date Monday-Friday 8am-4pm    Remember:  Do not eat after midnight the night before your surgery  You may drink clear liquids until 4:30am the morning of your surgery.   Clear liquids allowed are: Water, Non-Citrus Juices (without pulp), Carbonated Beverages, Clear Tea, Black Coffee Only, and Gatorade    Take these medicines the morning of surgery with A SIP OF WATER   albuterol inhaler  gabapentin (NEURONTIN) 600 MG tablet  metaxalone (SKELAXIN) 800 MG tablet  rosuvastatin (CRESTOR) 20 MG tablet  triamcinolone (NASACORT ALLERGY 24HR) 55 MCG/ACT AERO nasal inhaler  tamsulosin (FLOMAX) 0.4 MG CAPS capsule   methylphenidate (RITALIN) 20 MG tablet   Take these medicines as needed  acetaminophen (TYLENOL) 500 MG tablet  For pain  propranolol (INDERAL) 20 MG tablet as needed for anxiety  As of today, STOP taking any Aspirin (unless otherwise instructed by your surgeon) and Aspirin containing products, Aleve, Naproxen, Ibuprofen, Motrin, Advil, Goody's, BC's, all herbal medications, fish oil, and all vitamins.                      Do not wear jewelry, make up, or nail polish            Do not wear lotions, powders, perfumes/colognes, or deodorant.            Do not shave 48 hours prior to surgery.  Men may shave face and neck.            Do not bring valuables to the hospital.            [facility] is not responsible for any belongings or valuables.  Do NOT Smoke (Tobacco/Vapping) or drink Alcohol 24 hours prior to your procedure If  you use a CPAP at night, you may bring all equipment for your overnight stay.   Contacts, glasses, dentures or bridgework may not be worn into surgery.      For patients admitted to the hospital, discharge time will be determined by your treatment team.   Patients discharged the day of surgery will not be allowed to drive home, and someone needs to stay with them for 24 hours.    Special instructions:   [place]- Preparing For Surgery  Before surgery, you can play an important role. Because skin is not sterile, your skin needs to be as free of germs as possible. You can reduce the number of germs on your skin by washing with CHG (chlorahexidine gluconate) Soap before surgery.  CHG is an antiseptic cleaner which kills germs and bonds with the skin to continue killing germs even after washing.    Oral Hygiene is also important to reduce your risk of infection.  Remember - BRUSH YOUR TEETH THE MORNING OF SURGERY WITH YOUR REGULAR TOOTHPASTE  Please do not use if you have an allergy to CHG or antibacterial soaps. If your skin becomes reddened/irritated stop using the CHG.  Do not shave (including legs and underarms) for at least 48 hours prior to  first CHG shower. It is OK to shave your face.  Please follow these instructions carefully.   1. Shower the NIGHT BEFORE SURGERY and the MORNING OF SURGERY with CHG Soap.   2. If you chose to wash your hair, wash your hair first as usual with your normal shampoo.  3. After you shampoo, rinse your hair and body thoroughly to remove the shampoo.  4. Use CHG as you would any other liquid soap. You can apply CHG directly to the skin and wash gently with a scrungie or a clean washcloth.   5. Apply the CHG Soap to your body ONLY FROM THE NECK DOWN.  Do not use on open wounds or open sores. Avoid contact with your eyes, ears, mouth and genitals (private parts). Wash Face and genitals (private parts)  with your normal soap.   6. Wash thoroughly,  paying special attention to the area where your surgery will be performed.  7. Thoroughly rinse your body with warm water from the neck down.  8. DO NOT shower/wash with your normal soap after using and rinsing off the CHG Soap.  9. Pat yourself dry with a CLEAN TOWEL.  10. Wear CLEAN PAJAMAS to bed the night before surgery, wear comfortable clothes the morning of surgery  11. Place CLEAN SHEETS on your bed the night of your first shower and DO NOT SLEEP WITH PETS.   Day of Surgery:   Do not apply any deodorants/lotions.  Please wear clean clothes to the hospital/surgery center.   Remember to brush your teeth WITH YOUR REGULAR TOOTHPASTE.   Please read over the following fact sheets that you were given.

## 2020-02-19 ENCOUNTER — Encounter (HOSPITAL_COMMUNITY): Payer: Self-pay

## 2020-02-19 ENCOUNTER — Encounter (HOSPITAL_COMMUNITY)
Admission: RE | Admit: 2020-02-19 | Discharge: 2020-02-19 | Disposition: A | Discharge status: Home / Self Care | Payer: BC Managed Care – PPO | Source: Ambulatory Visit | Attending: Surgery | Admitting: Surgery

## 2020-02-19 ENCOUNTER — Other Ambulatory Visit: Payer: Self-pay

## 2020-02-19 DIAGNOSIS — Z01812 Encounter for preprocedural laboratory examination: Secondary | ICD-10-CM | POA: Insufficient documentation

## 2020-02-19 HISTORY — DX: Fatty (change of) liver, not elsewhere classified: K76.0

## 2020-02-19 HISTORY — DX: Calculus of gallbladder without cholecystitis without obstruction: K80.20

## 2020-02-19 HISTORY — DX: Gastro-esophageal reflux disease without esophagitis: K21.9

## 2020-02-19 HISTORY — DX: Attention-deficit hyperactivity disorder, unspecified type: F90.9

## 2020-02-19 HISTORY — DX: Acute pancreatitis without necrosis or infection, unspecified: K85.90

## 2020-02-19 HISTORY — DX: Other complications of anesthesia, initial encounter: T88.59XA

## 2020-02-19 LAB — COMPREHENSIVE METABOLIC PANEL
ALT: 28 U/L (ref 0–44)
AST: 22 U/L (ref 15–41)
Albumin: 4.2 g/dL (ref 3.5–5.0)
Alkaline Phosphatase: 39 U/L (ref 38–126)
Anion gap: 10 (ref 5–15)
BUN: 13 mg/dL (ref 6–20)
CO2: 23 mmol/L (ref 22–32)
Calcium: 9.3 mg/dL (ref 8.9–10.3)
Chloride: 105 mmol/L (ref 98–111)
Creatinine, Ser: 1.23 mg/dL (ref 0.61–1.24)
GFR calc Af Amer: >60 mL/min (ref >60)
GFR calc non Af Amer: >60 mL/min (ref >60)
Glucose, Bld: 143 mg/dL — ABNORMAL HIGH (ref 70–99)
Potassium: 4 mmol/L (ref 3.5–5.1)
Sodium: 138 mmol/L (ref 135–145)
Total Bilirubin: 0.4 mg/dL (ref 0.3–1.2)
Total Protein: 7 g/dL (ref 6.5–8.1)

## 2020-02-19 LAB — CBC
HCT: 50.3 % (ref 39.0–52.0)
Hemoglobin: 16.1 g/dL (ref 13.0–17.0)
MCH: 29.9 pg (ref 26.0–34.0)
MCHC: 32 g/dL (ref 30.0–36.0)
MCV: 93.5 fL (ref 80.0–100.0)
Platelets: 254 10*3/uL (ref 150–400)
RBC: 5.38 MIL/uL (ref 4.22–5.81)
RDW: 14 % (ref 11.5–15.5)
WBC: 8.4 10*3/uL (ref 4.0–10.5)
nRBC: 0 % (ref 0.0–0.2)

## 2020-02-19 NOTE — Pre-Procedure Instructions (Addendum)
Sean Snow  02/19/2020    Your procedure is scheduled on Tuesday, March 02, 2020 at 7:30 AM.   Report to [facility] Entrance "A" Admitting Office at 5:30 AM.   Call this number if you have problems the morning of surgery: [phone]   Questions prior to day of surgery, please call [phone] between 8 & 4 PM.   Remember:  Do not eat food after midnight Monday, 03/01/20.  You may drink clear liquids until 4:30 AM.  Clear liquids allowed are: Water, Juice (non-citric and without pulp - diabetics please choose diet or no sugar options), Carbonated beverages - (diabetics please choose diet or no sugar options), Clear Tea, Black Coffee only (no creamer, milk or cream including half and half) and Gatorade (diabetics please choose diet or no sugar options)   Drink the Pre-Surgery Ensure between 4:15 AM and 4:30 AM the morning of surgery. This will be the last liquids that you will have prior to surgery.   Take these medicines the morning of surgery with A SIP OF WATER: Gabapentin (Neurontin), Fexofenadine(Allegra),  Tamsulosin (Flomax) - if needed, Propanolol (Inderal) - if needed,  Acetaminophen (Tylenol) - if needed, Nasocort nasal spray,  Ipratropium (Atrovent) nasal spray - if needed, Xopenex inhaler - if needed (bring inhaler with you day of surgery)  Stop Herbal medications (Melatonin), Multivitamins and Fish Oil 7 days prior to surgery. Do not use Aspirin products (Goody's, BC Powders, etc) or NSAIDS (Ibuprofen, Aleve, etc) 7 days prior to surgery.  Please call the pharmacy [phone] to have them review your medications    Do not wear jewelry.  Do not wear lotions, powders, cologne or deodorant.  Men may shave face and neck.  Do not bring valuables to the hospital.  [facility] is not responsible for any belongings or valuables.  Contacts, dentures or bridgework may not be worn into surgery.  Leave your suitcase in the car.  After surgery it may be brought to your  room.  For patients admitted to the hospital, discharge time will be determined by your treatment team.  Patients discharged the day of surgery will not be allowed to drive home.   [facility] - Preparing for Surgery  Before surgery, you can play an important role.  Because skin is not sterile, your skin needs to be as free of germs as possible.  You can reduce the number of germs on you skin by washing with CHG (chlorahexidine gluconate) soap before surgery.  CHG is an antiseptic cleaner which kills germs and bonds with the skin to continue killing germs even after washing.  Oral Hygiene is also important in reducing the risk of infection.  Remember to brush your teeth with your regular toothpaste the morning of surgery.  Please DO NOT use if you have an allergy to CHG or antibacterial soaps.  If your skin becomes reddened/irritated stop using the CHG and inform your nurse when you arrive at Short Stay.  Do not shave (including legs and underarms) for at least 48 hours prior to the first CHG shower.  You may shave your face.  Please follow these instructions carefully:   1.  Shower with CHG Soap the night before surgery and the morning of Surgery.  2.  If you choose to wash your hair, wash your hair first as usual with your normal shampoo.  3.  After you shampoo, rinse your hair and body thoroughly to remove the shampoo. 4.  Use CHG as you would any other  liquid soap.  You can apply chg directly to the skin and wash gently with a      scrungie or washcloth.           5.  Apply the CHG Soap to your body ONLY FROM THE NECK DOWN.   Do not use on open wounds or open sores. Avoid contact with your eyes, ears, mouth and genitals (private parts).  Wash genitals (private parts) with your normal soap - do this prior to using CHG soap.  6.  Wash thoroughly, paying special attention to the area where your surgery will be performed.  7.  Thoroughly rinse your body with warm water from the neck down.  8.   DO NOT shower/wash with your normal soap after using and rinsing off the CHG Soap.  9.  Pat yourself dry with a clean towel.            10.  Wear clean pajamas.            11.  Place clean sheets on your bed the night of your first shower and do not sleep with pets.  Day of Surgery  Shower as above. Do not apply any lotions/deodorants the morning of surgery.   Please wear clean clothes to the hospital. Remember to brush your teeth with toothpaste.  Please read over the fact sheets that you were given.

## 2020-02-19 NOTE — Progress Notes (Signed)
PCP - Dr. Shelia Media Cardiologist - Dr. Percival Spanish  EKG - 06/17/19 ECHO - 10/24/16  Sleep Study - not sure when CPAP - yes  ERAS Protcol - yes PRE-SURGERY Ensure - yes  COVID TEST- scheduled for 02/27/20  Anesthesia review: yes - heart history Pt is concerned that he has always had trouble urinating after anesthesia. He states he has always gone home with a catheter and wants to make sure he does with this surgery.  Patient denies shortness of breath, fever, cough and chest pain at PAT appointment   All instructions explained to the patient, with a verbal understanding of the material. Patient agrees to go over the instructions while at home for a better understanding. Patient also instructed to self quarantine after being tested for COVID-19. The opportunity to ask questions was provided.

## 2020-02-20 ENCOUNTER — Encounter (HOSPITAL_COMMUNITY): Payer: Self-pay | Admitting: Vascular Surgery

## 2020-02-20 NOTE — Anesthesia Preprocedure Evaluation (Deleted)
Anesthesia Evaluation Anesthesia Physical Anesthesia Plan  ASA:   Anesthesia Plan:    Post-op Pain Management:    Induction:   PONV Risk Score and Plan:   Airway Management Planned:   Additional Equipment:   Intra-op Plan:   Post-operative Plan:   Informed Consent:   Plan Discussed with:   Anesthesia Plan Comments: (See APP note by A. Vyctoria Dickman, FNP )        Anesthesia Quick Evaluation  

## 2020-02-20 NOTE — Progress Notes (Addendum)
Anesthesia Chart Review:  Case: [id number] Date/Time: 03/02/20 0715   Procedure: APPENDECTOMY LAPAROSCOPIC (N/A )   Anesthesia type: General   Pre-op diagnosis: FECALITH OF APPENDIX   Location: [place] OR ROOM 05 / [place] [facility]   Surgeons: Coralie Keens, MD      DISCUSSION: Patient is a 59 year old male scheduled for the above procedure.  History includes never smoker, CAD (coronary calcifications seen on CT during bladder cancer evaluation ~ 2018), OSA (CPAP), hypercholesterolemia, GERD, pancreatitis (history of), fatty liver (on CT), recurrent bladder cancer (diagnosed 2017, s/p multiple TURBT, last 05/28/19; s/p immunotherapy), ADHD, exercise-induced asthma, right inguinal hernia repair (2000). BMI is consistent with obesity. History of angioedema with aspirin.  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  He reported that he has difficulty urinating after anesthesia and has required home foley catheter at discharge several times in the past.  **Pt requests the urinary catheter be left in place and he will remove it at home after discharge.  He reports leaving urinary catheter in place after anesthesia has been necessary after his most recent 7 procedures (related to bladder cancer treatment).  He states he is competent to d/c foley at home.   - Pt has trouble with opioids impacting bladder function after anesthesia.  He prefers not to be given tramadol (had poor experience with it).  He reports his pain has been successfully treated for pain post-op with tylenol and gabapentin   Last cardiology evaluation on 06/17/2019 by Rosaria Ferries, PA-C.  No ischemic symptoms at that time.  No additional testing recommended.  1 year follow-up planned.  BP at PAT was 135/99. He is not on any antihypertensives (propranolol is 3 times daily as needed). BP 148/92 at 06/17/19 cardiology visit and 143/93 at 10/17/19 urology visit. He is on medication for ADHD (no longer on Ritalin but on dextroamphetamine 2 tablets every  morning, noon, and at 4 PM) which could be contributing. Ideally, would like to see if he is monitoring BP at home, and if numbers are consistently elevated than notify Dr. Ninfa Linden and have patient communicate with his PCP.  Will leave chart for anesthesia APP to follow-up next week.   Preoperative COVID-19 test is scheduled for 02/27/2020.    VS: BP (!) 135/99   Pulse 86   Temp 37.1 C (Oral)   Resp 18   Ht 5\' 7"  (1.702 m)   Wt 94.2 kg   SpO2 99%   BMI 32.51 kg/m  It does not look like BP was rechecked at PAT.   PROVIDERS: Deland Pretty, MD his PCP Minus Breeding, MD is cardiologist Roxanne Mins, MD is urologist ([place]). Last evaluation 10/17/2019, unscheduled visit at request of PCP for RLQ symptomology. CT scan obtained with stable GU findings without evidence of urolithiasis, but dilated appendix, so referred to GI specialist.    LABS: Labs reviewed: Acceptable for surgery. A1c 6.4% 09/12/19 (Dr. Shelia Media) (all labs ordered are listed, but only abnormal results are displayed)  Labs Reviewed  COMPREHENSIVE METABOLIC PANEL - Abnormal; Notable for the following components:      Result Value   Glucose, Bld 143 (*)    All other components within normal limits  CBC    IMAGES: CT Abd/pelvis 10/08/19: IMPRESSION: 1. Dilated appendix without classic signs of appendicitis. The patient may be at risk for developing appendicitis due to stool like material distending the lumen. Appearance is unchanged based on comparison with exam from December and contrast passes into the appendix and  around inspissated material within the lumen. 2. Mild asymmetric perinephric stranding is also stable. Correlation with urine studies could potentially be of benefit to exclude mild UTI. 3. Moderate fat containing left inguinal hernia. 4. Possible background hepatic steatosis. 5. Aortic atherosclerosis. 6. Signs of right inguinal herniorrhaphy.  CTChest 04/30/17 [facility]): Result  Impression: 1. No findings of primary or metastatic disease involving the thorax. Specifically, I see no pathology to correlate with the suspicious finding on the patient's March 06, 2017 chest radiograph. 2. A moderate/heavy burden of calcified atherosclerotic disease is evident within the coronary arterial distribution.   EKG: 06/17/19 ([facility]):  Sinus Rhyhm Inferior infarct, age undetermined (Q wave in III and aVF are old) Artifact and baseline wanderer   CV: ETT 10/24/16:  Blood pressure demonstrated a normal response to exercise.  There was no ST segment deviation noted during stress. ETT with normal exercise tolerance (9:09); no chest pain; no ST changes; normal BP response; negative adequate ETT; Duke treadmill score 9.   Past Medical History:  Diagnosis Date  . ADHD   . Asthma    Exercise induced  . Bladder cancer ([place])   . Complication of anesthesia    trouble urinating after surgery, had to go home with a catheter every time, needs as little of opiates that can be given. cannot take Tramadol  . Coronary artery calcification 2017  . Fatty liver    found on CT scan per pt  . Gallstones   . GERD (gastroesophageal reflux disease)    in the past  . Hypercholesteremia   . Pancreatitis   . Sleep apnea    CPAP    Past Surgical History:  Procedure Laterality Date  . BLADDER SURGERY    . COLONOSCOPY    . HERNIA REPAIR  2000   inguinal on right    MEDICATIONS: . Coenzyme Q10 (CO Q-10) 300 MG CAPS  . dextroamphetamine (DEXTROSTAT) 10 MG tablet  . fexofenadine (ALLEGRA) 180 MG tablet  . levalbuterol (XOPENEX HFA) 45 MCG/ACT inhaler  . Multiple Vitamin (MULTIVITAMINS PO)  . acetaminophen (TYLENOL) 500 MG tablet  . albuterol (PROVENTIL HFA;VENTOLIN HFA) 108 (90 Base) MCG/ACT inhaler  . ezetimibe (ZETIA) 10 MG tablet  . gabapentin (NEURONTIN) 600 MG tablet  . ipratropium (ATROVENT) 0.06 % nasal spray  . MELATONIN PO  . metaxalone (SKELAXIN) 800 MG tablet  .  methylphenidate (RITALIN) 20 MG tablet  . NON FORMULARY  . Omega-3 Fatty Acids (FISH OIL PO)  . propranolol (INDERAL) 20 MG tablet  . rosuvastatin (CRESTOR) 20 MG tablet  . tamsulosin (FLOMAX) 0.4 MG CAPS capsule  . testosterone cypionate (DEPOTESTOTERONE CYPIONATE) 100 MG/ML injection  . triamcinolone (NASACORT ALLERGY 24HR) 55 MCG/ACT AERO nasal inhaler   No current facility-administered medications for this encounter.  Per med list, he is currently holding fish oil for surgery; no longer taking Skelaxin, albuterol, or Ritalin; taking gabapentin only as needed; has not required melatonin on in a "long time"; and takes propranolol 3 times daily as needed (anxiety).   Myra Gianotti, PA-C Surgical Short Stay/Anesthesiology [facility] Phone [phone] [facility] Phone [phone] 02/21/2020 10:40 AM   Addendum 02/26/20:   I have left messages for pt to call me back regarding his home monitoring of BP (if any) but pt has not responded.  I left a message for Dr. Ninfa Linden with triage nurse Kennyth Lose regarding elevated diastolic BP.   Willeen Cass, FNP-BC [facility] Short Stay Surgical Center/Anesthesiology Phone: [phone] 02/26/2020 3:26 PM   Addendum 02/27/20:  Pt returned my call.  He does not check his blood pressure at home stating he has medical care appointments frequently and his BP is checked there.  He reports feeling anxious about surgery at his pre-admission testing appointment and his diastolic BP is not usually that high.     If no changes, I anticipate pt can proceed with surgery as scheduled.   Willeen Cass, FNP-BC [facility] Short Stay Surgical Center/Anesthesiology Phone: [phone] 02/27/2020 9:12 AM

## 2020-02-27 ENCOUNTER — Other Ambulatory Visit (HOSPITAL_COMMUNITY)
Admission: RE | Admit: 2020-02-27 | Discharge: 2020-02-27 | Disposition: A | Discharge status: Home / Self Care | Payer: BC Managed Care – PPO | Source: Ambulatory Visit | Attending: Surgery | Admitting: Surgery

## 2020-02-27 DIAGNOSIS — U071 COVID-19: Secondary | ICD-10-CM | POA: Insufficient documentation

## 2020-02-28 LAB — SARS CORONAVIRUS 2 (TAT 6-24 HRS): SARS Coronavirus 2: POSITIVE — AB

## 2020-02-28 NOTE — Progress Notes (Signed)
Dr. Deland Pretty called back and informed of the + covid result for this pt.

## 2020-02-28 NOTE — Progress Notes (Addendum)
Spoke to Dr. Dema Severin who advised case was going to be cancelled. Dr. Dema Severin states he has been unable to get in contact with patient with multiple attempts. I called patient's phone number and wife's phone number as well. I left message and was unable to contact. Spoke to Mudlogger who requested I contact patient. I contacted patient and advised him that surgery was cancelled per Dr. Dema Severin due to lab results (ok to give results per L. Paxton/ Dr. Dema Severin). Patient requested that Dr. Dema Severin contact him. I relayed the message to Dr. Dema Severin.

## 2020-02-28 NOTE — Progress Notes (Signed)
Sean Snow from the on-call service for Dr. Evlyn Courier with + covid results for this pt collected on Fri 5/28 for his procedure on 6/1 at [facility] Or at [place].   These are the current guidelines:  Positive Results for:  Asymptomatic: Procedure postponed and quarantine for 10 days. Symptomatic: Procedure postponed and quarantine for 14 days. Immunocompromised: Procedure postponed and quarantine for 20 days.  The pt will not be retested for 90 days from the + result.

## 2020-02-29 ENCOUNTER — Telehealth: Payer: Self-pay | Admitting: Physician Assistant

## 2020-02-29 ENCOUNTER — Telehealth: Payer: Self-pay | Admitting: Nurse Practitioner

## 2020-02-29 ENCOUNTER — Telehealth: Payer: Self-pay | Admitting: Adult Health

## 2020-02-29 NOTE — Telephone Encounter (Signed)
LMOM about COVID 19 monoclonal antibody treatment for COVID 19.  Asked that he call us back.    Sean Bihari, NP

## 2020-02-29 NOTE — Telephone Encounter (Signed)
Called to discuss with patient about Covid symptoms and the use of bamlanivimab/etesevimab or casirivimab/imdevimab, a monoclonal antibody infusion for those with mild to moderate Covid symptoms and at a high risk of hospitalization.  Pt is qualified for this infusion at the [facility] infusion center due to immuno-compromised ( bladder cancer) and BMI>25  Message left to call back  Angelena Form PA-C  [facility]

## 2020-02-29 NOTE — Telephone Encounter (Signed)
Called to discuss with Sean Snow about Covid symptoms and the use of bamlanivimab/etesevimab or casirivimab/imdevimab, a monoclonal antibody infusion for those with mild to moderate Covid symptoms and at a high risk of hospitalization.     Pt is qualified for this infusion at the [facility] infusion center due to co-morbid conditions and/or a member of an at-risk group, however declines infusion at this time. Symptoms tier reviewed as well as criteria for ending isolation.  Symptoms reviewed that would warrant ED/Hospital evaluation. Preventative practices reviewed. Patient verbalized understanding. Patient advised to call back if he decides that he does want to get infusion. Callback number to the infusion center given. Patient advised to go to Urgent care or ED with severe symptoms. Last date pt would be eligible for infusion is 03/08/20.   Pt would like to think about it and get back to [place].    Patient Active Problem List   Diagnosis Date Noted  . SOB (shortness of breath) 09/13/2016  . Elevated coronary artery calcium score 09/13/2016  . Dyslipidemia 09/13/2016    Angelena Form PA-C

## 2020-02-29 NOTE — Telephone Encounter (Signed)
Called to discuss with Sabino Dick about Covid symptoms and the use of  bamlanivimab/etesevimab or casirivimab/imdevimab, a combination monoclonal antibody infusion for those with mild to moderate Covid symptoms and at a high risk of hospitalization.     Pt is qualified for this infusion at the [facility] infusion center due to co-morbid conditions (BMI >25, immunocompromised).   Unable to reach. Voicemail left.   Patient Active Problem List   Diagnosis Date Noted  . SOB (shortness of breath) 09/13/2016  . Elevated coronary artery calcium score 09/13/2016  . Dyslipidemia 09/13/2016    Alda Lea, AGPCNP-BC Pager: [phone] Amion: Bjorn Pippin

## 2020-03-02 ENCOUNTER — Encounter (HOSPITAL_COMMUNITY): Admission: RE | Payer: Self-pay | Source: Home / Self Care

## 2020-03-02 ENCOUNTER — Ambulatory Visit (HOSPITAL_COMMUNITY): Admission: RE | Admit: 2020-03-02 | Payer: BC Managed Care – PPO | Source: Home / Self Care | Admitting: Surgery

## 2020-03-02 ENCOUNTER — Encounter (HOSPITAL_COMMUNITY): Payer: Self-pay | Admitting: Vascular Surgery

## 2020-03-02 SURGERY — APPENDECTOMY, LAPAROSCOPIC
Anesthesia: General

## 2020-04-21 ENCOUNTER — Ambulatory Visit: Admit: 2020-04-21 | Payer: BC Managed Care – PPO | Admitting: Surgery

## 2020-04-21 SURGERY — APPENDECTOMY, LAPAROSCOPIC
Anesthesia: General

## 2020-07-07 IMAGING — CT CT ABD-PELV W/ CM
2 of 4 series · 15 of 46 positions shown, 17 images · IV contrast (Omni 300)
Comparison: Comparison made over Gadi Dadon system from
09/09/2019

CLINICAL DATA: History of bladder cancer with abdominal pain.

EXAM:
CT ABDOMEN AND PELVIS WITH CONTRAST
TECHNIQUE: Multidetector CT imaging of the abdomen and pelvis was performed
using the standard protocol following bolus administration of
intravenous contrast.
CONTRAST:  100mL OMNIPAQUE IOHEXOL 300 MG/ML  SOLN

[Series 3: a/p w/ 5mm · axial · 0.77mm/px · z∈[+717,+1192]mm · 12 of 105 slices shown, 14 images]
[im 5/105  soft-tissue]
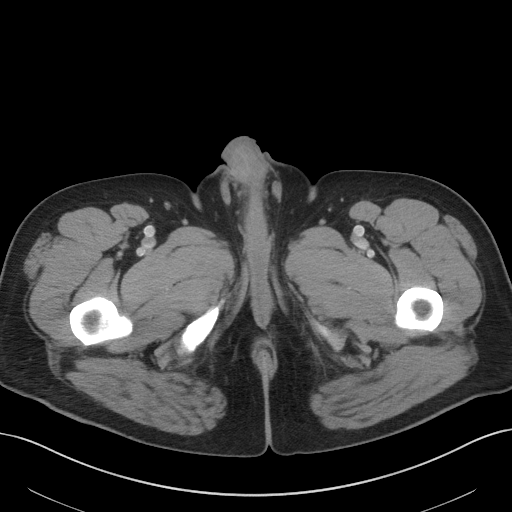
[im 5/105  bone]
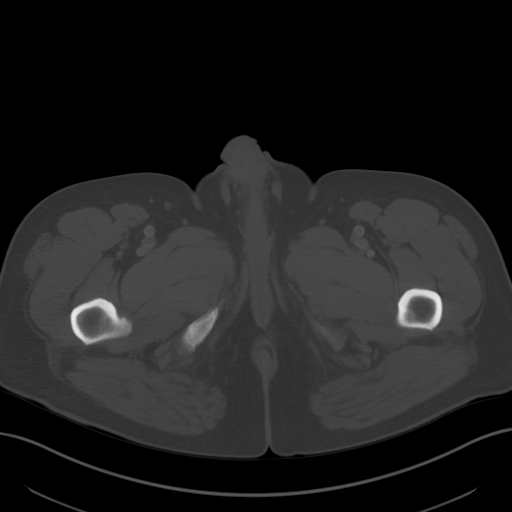
[im 14/105  soft-tissue]
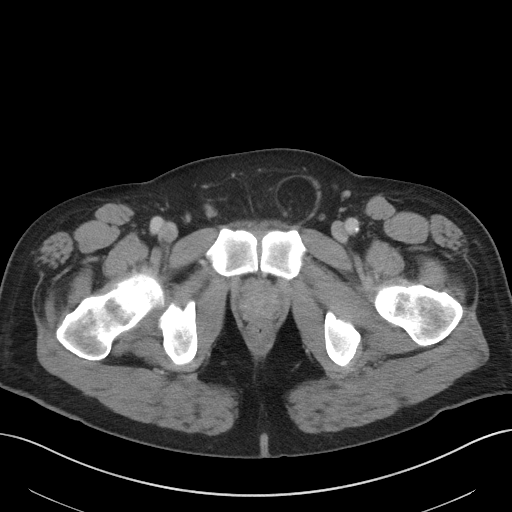
[im 22/105  soft-tissue]
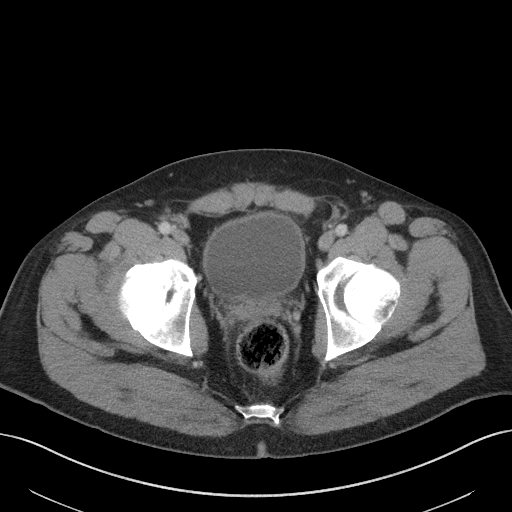
[im 31/105  soft-tissue]
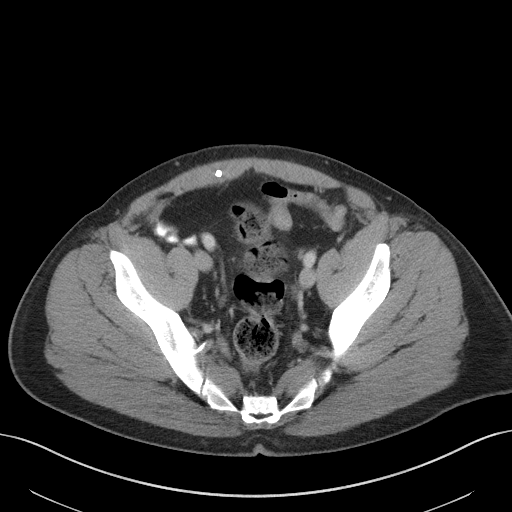
[im 40/105  soft-tissue]
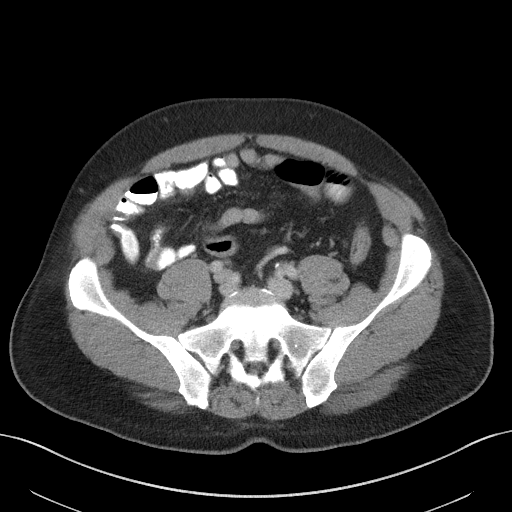
[im 48/105  soft-tissue]
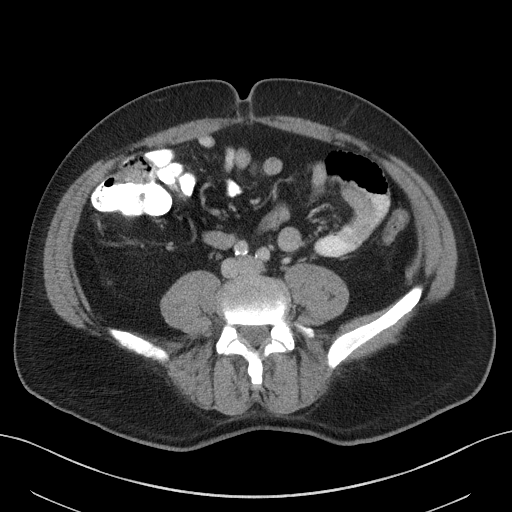
[im 57/105  soft-tissue]
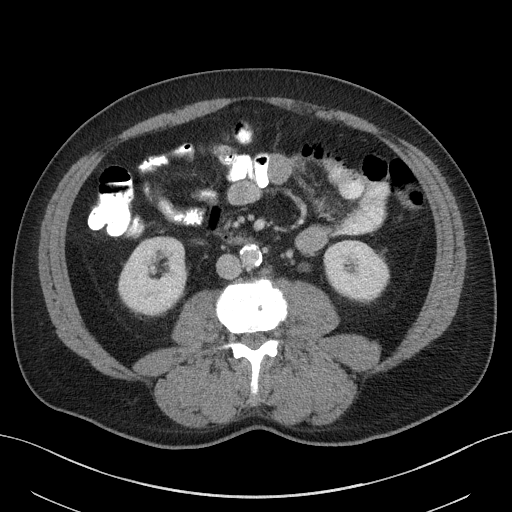
[im 66/105  soft-tissue]
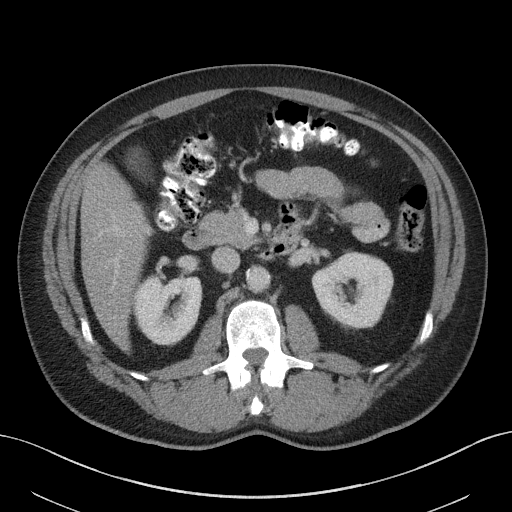
[im 74/105  soft-tissue]
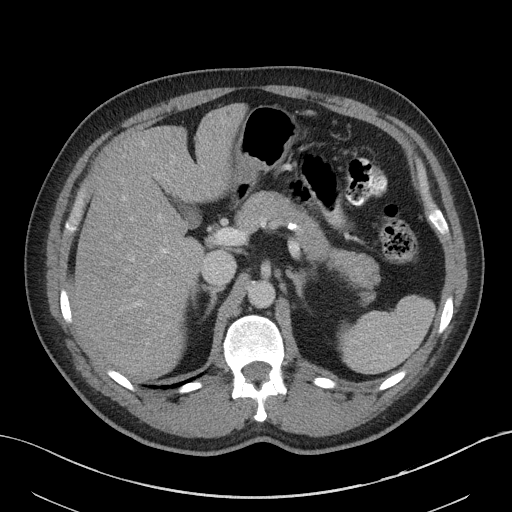
[im 74/105  bone]
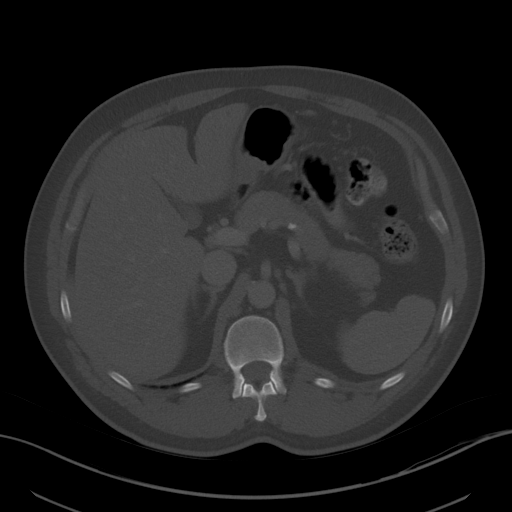
[im 83/105  soft-tissue]
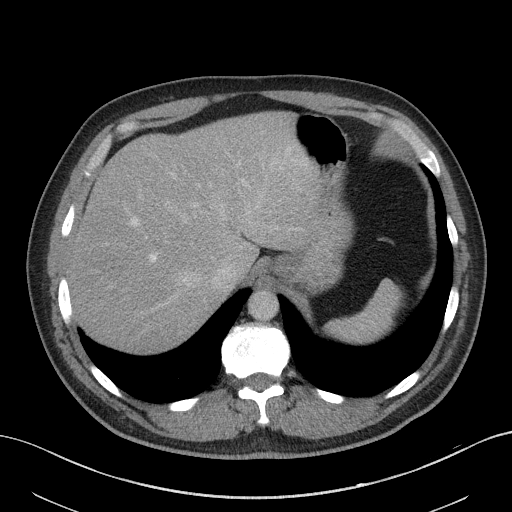
[im 92/105  soft-tissue]
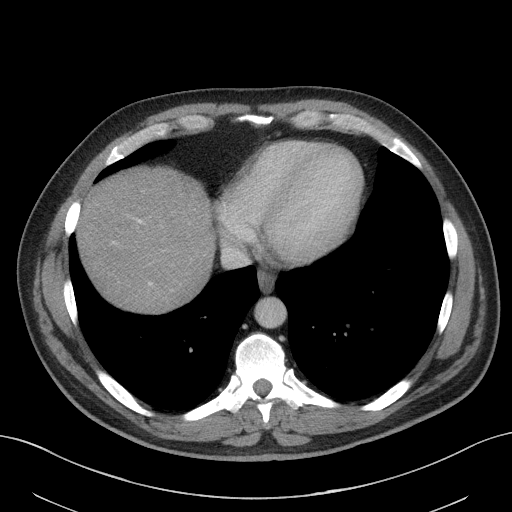
[im 100/105  soft-tissue]
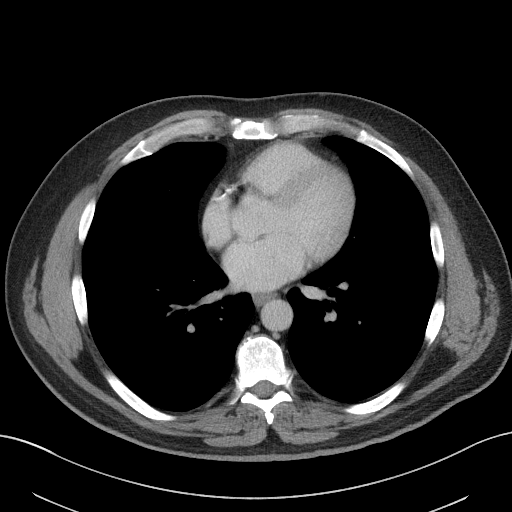

[Series 6: a/p w/ cor · coronal · 0.78mm/px · 3 of 151 slices shown]
[im 51/151  soft-tissue]
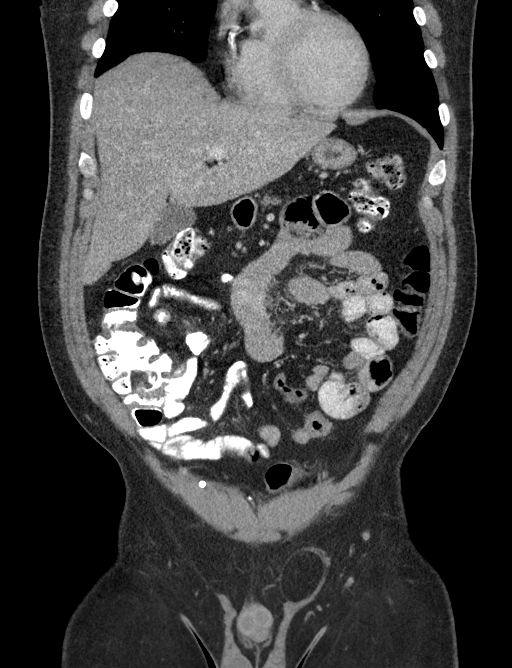
[im 67/151  soft-tissue]
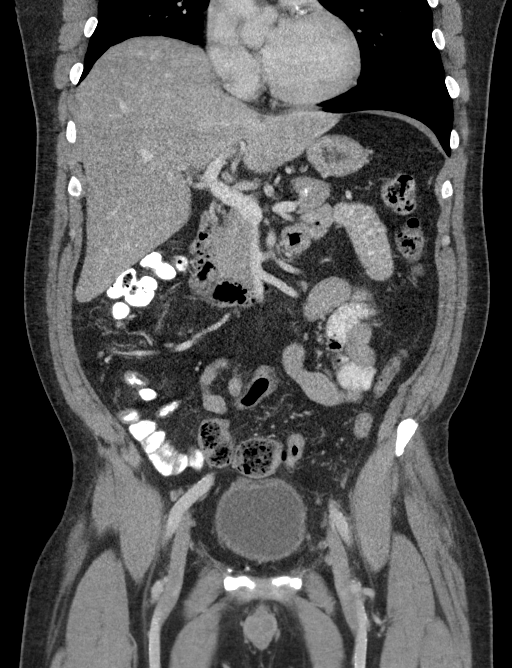
[im 84/151  soft-tissue]
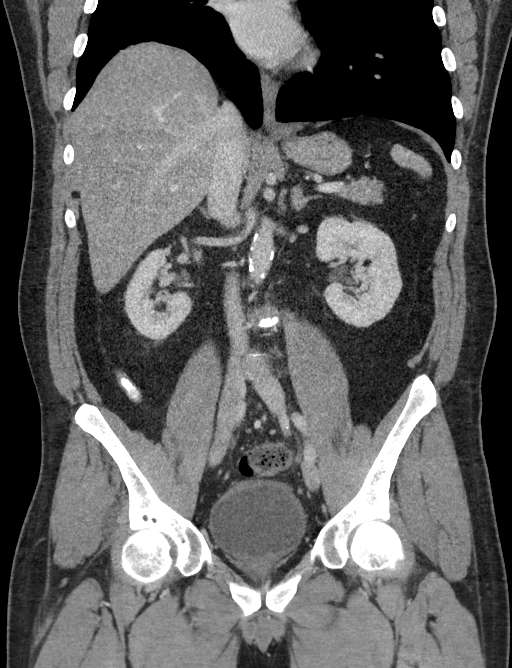

[15 of 46 positions shown; findings below may reference images not displayed]

FINDINGS: Lower chest: Incidental imaging of the lung bases is normal.

Hepatobiliary: Small low-density lesion in the left hepatic lobe
likely cyst unchanged from 3699. Possible background hepatic
steatosis without suspicious hepatic lesion. Portal vein is patent.

Biliary tree is unremarkable.

Pancreas: Pancreas without signs of focal lesion or ductal dilation.
No peripancreatic inflammation.

Spleen: Spleen is normal.

Adrenals/Urinary Tract: Adrenal glands and kidneys without acute
findings. Urinary bladder under distended without visible lesion.
Very mild distension of the left ureter but no signs of collecting
system dilation.

Stomach/Bowel: Appendix is dilated to approximately 1.2 cm best seen
on coronal images. There is more dense material in the distal aspect
of the appendix and the appendix is self is filled with enteric
contrast. Wall thickness just beyond the appendiceal orifice at the
cecal tip is mildly thickened approximately 3 mm. Wall thickness
beyond this appears normal. Wall enhancement cannot be assessed due
to material within the appendix.

The appearance is quite similar when compared to the prior study
though on today's study contrast could be seen passing into the
appendix and surrounding and extending distal to stool-like material
which fills the lumen of the appendix.

Vascular/Lymphatic: Vascular structures in the abdomen are patent.
Calcified and noncalcified atherosclerotic plaque is
mild-to-moderate in the abdominal aorta. No signs of adenopathy in
the retroperitoneum or in the upper abdomen.

No signs of pelvic lymphadenopathy.

Reproductive: Nonspecific appearance of the prostate.

Other: Signs of right inguinal herniorrhaphy. Moderate fat
containing left inguinal hernia.

Musculoskeletal: Spinal degenerative changes without acute or
destructive bone process.
IMPRESSION: 1. Dilated appendix without classic signs of appendicitis. The
patient may be at risk for developing appendicitis due to stool like
material distending the lumen. Appearance is unchanged based on
comparison with exam from [place] and contrast passes into the
appendix and around inspissated material within the lumen.
2. Mild asymmetric perinephric stranding is also stable. Correlation
with urine studies could potentially be of benefit to exclude mild
UTI.
3. Moderate fat containing left inguinal hernia.
4. Possible background hepatic steatosis.
5. Aortic atherosclerosis.
6. Signs of right inguinal herniorrhaphy.
7. These results were called by telephone at the time of
interpretation on 10/08/2019 at [DATE] to provider DLEKE TIGER , who
verbally acknowledged these results.

Aortic Atherosclerosis ([id number]).

## 2020-09-14 ENCOUNTER — Other Ambulatory Visit: Payer: Self-pay | Admitting: Physician Assistant

## 2020-09-15 NOTE — Telephone Encounter (Signed)
This is Dr. Hochrein's pt. °

## 2021-03-15 ENCOUNTER — Other Ambulatory Visit: Payer: Self-pay | Admitting: Cardiology

## 2021-06-24 ENCOUNTER — Other Ambulatory Visit: Payer: Self-pay | Admitting: Cardiology

## 2021-06-24 ENCOUNTER — Telehealth: Payer: Self-pay | Admitting: Cardiology

## 2021-06-24 NOTE — Telephone Encounter (Signed)
*  STAT* If patient is at the pharmacy, call can be transferred to refill team.   1. Which medications need to be refilled? (please list name of each medication and dose if known)  rosuvastatin (CRESTOR) 20 MG tablet  2. Which pharmacy/location (including street and city if local pharmacy) is medication to be sent to? [place], [address]  3. Do they need a 30 day or 90 day supply?  90 day supply

## 2021-09-08 NOTE — Progress Notes (Signed)
Cardiology Office Note   Date:  09/10/2021   ID:  Sean Snow, DOB [birth year], MRN [id number]  PCP:  Deland Pretty, MD  Cardiologist:   Minus Breeding, MD   Chief Complaint  Patient presents with   Elevated Coronary Calcium       History of Present Illness: Sean Snow is a 60 y.o. male who presents for follow-up of coronary calcium.  I have not seen him since 2017.  At that time he was incidentally found to have calcium in his coronaries when he had abdominal imaging.   He was last seen in our office in 2020.  He has coronary calcium.  He had a negative POET (Plain Old Exercise Treadmill) 2018.      He has been diagnosed with bladder cancer and had this treated.  He was walking about 16,000 steps a day and he is down to about 7500 right now.  He had a bit of a foot injury. The patient denies any new symptoms such as chest discomfort, neck or arm discomfort. There has been no new shortness of breath, PND or orthopnea. There have been no reported palpitations, presyncope or syncope.    Past Medical History:  Diagnosis Date   ADHD    Asthma    Exercise induced   Bladder cancer ([place])    Complication of anesthesia    trouble urinating after surgery, had to go home with a catheter every time, needs as little of opiates that can be given. cannot take Tramadol   Coronary artery calcification 2017   Fatty liver    found on CT scan per pt   Gallstones    GERD (gastroesophageal reflux disease)    in the past   Hypercholesteremia    Pancreatitis    Sleep apnea    CPAP    Past Surgical History:  Procedure Laterality Date   BLADDER SURGERY     COLONOSCOPY     HERNIA REPAIR  2000   inguinal on right     Current Outpatient Medications  Medication Sig Dispense Refill   acetaminophen (TYLENOL) 650 MG CR tablet Take 650 mg by mouth every 6 (six) hours.     Coenzyme Q10 (COQ10) 200 MG CAPS Take 200 mg by mouth daily.     ezetimibe (ZETIA) 10 MG tablet Take 10 mg by  mouth at bedtime.      fexofenadine (ALLEGRA) 180 MG tablet Take 180 mg by mouth daily.     gabapentin (NEURONTIN) 600 MG tablet Take 300-600 tablets by mouth at bedtime as needed (neck pain).      ipratropium (ATROVENT) 0.03 % nasal spray Place 1 spray into both nostrils 3 (three) times daily as needed for rhinitis.     levalbuterol (XOPENEX HFA) 45 MCG/ACT inhaler Inhale 2 puffs into the lungs every 6 (six) hours as needed for wheezing.     methylphenidate (RITALIN) 20 MG tablet Take 20 mg by mouth 4 (four) times daily.     Multiple Vitamin (MULTIVITAMINS PO) Take 1 tablet by mouth daily.      NON FORMULARY CPAP     Omega-3 Fatty Acids (FISH OIL) 1000 MG CAPS Take 1,000 mg by mouth daily.     oxybutynin (DITROPAN) 5 MG tablet Take 5 mg by mouth 2 (two) times daily as needed (cystoscopy).     propranolol (INDERAL) 20 MG tablet Take 20 mg by mouth 3 (three) times daily as needed (anxiety).      rosuvastatin (CRESTOR) 20 MG  tablet Take 1 tablet (20 mg total) by mouth daily. PATIENT MUST KEEP UPCOMING APPOINTMENT FOR FUTURE REFILLS 90 tablet 0   Simethicone (GAS-X PO) Take 1 tablet by mouth daily as needed (gas).     tamsulosin (FLOMAX) 0.4 MG CAPS capsule Take 0.4 mg by mouth See admin instructions. Take 0.4 mg the night before any procedure and 0.4 mg at night the 6 days after any procedure     testosterone cypionate (DEPOTESTOSTERONE CYPIONATE) 200 MG/ML injection Inject 100 mg into the muscle every Saturday.     triamcinolone (NASACORT) 55 MCG/ACT AERO nasal inhaler Place 2 sprays into the nose daily as needed (allergies).      dextroamphetamine (DEXTROSTAT) 10 MG tablet Take 20 mg by mouth See admin instructions. Take 20 mg by mouth in the morning, at noon, and at 4 pm (Patient not taking: Reported on 09/09/2021)     No current facility-administered medications for this visit.    Allergies:   Aspirin, Nsaids, Other, Penicillins, Sulfa antibiotics, and Tramadol     ROS:  Please see the  history of present illness.   Otherwise, review of systems are positive for neck pain.   All other systems are reviewed and negative.    PHYSICAL EXAM: VS:  BP 140/80   Pulse 88   Ht 5\' 10"  (1.778 m)   Wt 199 lb 3.2 oz (90.4 kg)   SpO2 97%   BMI 28.58 kg/m  , BMI Body mass index is 28.58 kg/m. GENERAL:  Well appearing HEENT:  Pupils equal round and reactive, fundi not visualized, oral mucosa unremarkable NECK:  No jugular venous distention, waveform within normal limits, carotid upstroke brisk and symmetric, no bruits, no thyromegaly LYMPHATICS:  No cervical, inguinal adenopathy LUNGS:  Clear to auscultation bilaterally BACK:  No CVA tenderness CHEST:  Unremarkable HEART:  PMI not displaced or sustained,S1 and S2 within normal limits, no S3, no S4, no clicks, no rubs, no murmurs ABD:  Flat, positive bowel sounds normal in frequency in pitch, no bruits, no rebound, no guarding, no midline pulsatile mass, no hepatomegaly, no splenomegaly EXT:  2 plus pulses throughout, no edema, no cyanosis no clubbing SKIN:  No rashes no nodules NEURO:  Cranial nerves II through XII grossly intact, motor grossly intact throughout PSYCH:  Cognitively intact, oriented to person place and time    EKG:  EKG is ordered today. The ekg ordered today demonstrates sinus rhythm, rate 88, axis within normal limits, intervals within normal limits, no acute ST-T wave changes.   Recent Labs: No results found for requested labs within last 8760 hours.    Lipid Panel No results found for: CHOL, TRIG, HDL, CHOLHDL, VLDL, LDLCALC, LDLDIRECT    Wt Readings from Last 3 Encounters:  09/09/21 199 lb 3.2 oz (90.4 kg)  02/19/20 207 lb 9.6 oz (94.2 kg)  06/17/19 238 lb (108 kg)      Other studies Reviewed: Additional studies/ records that were reviewed today include: Labs. Review of the above records demonstrates:  Please see elsewhere in the note.     ASSESSMENT AND PLAN:  ELEVATED CORONARY CALCIUM:   He  has no symptoms.  He walks for exercise every day.  No change in therapy.  No further testing.  DYSLIPIDEMIA: We had a long discussion about this.  He is going to try to modify his diet and in about 3 months get another lipid profile.  His LDL was mildly elevated at 105 last year.  I would suggest a goal  of less than 100.  If he is not at this level I would suggest increasing his Crestor to 40 mg daily.   Current medicines are reviewed at length with the patient today.  The patient does not have concerns regarding medicines.  The following changes have been made:  no change  Labs/ tests ordered today include: None No orders of the defined types were placed in this encounter.    Disposition:   FU with me in 18 months.     Signed, Minus Breeding, MD  09/10/2021 9:21 PM    [place]

## 2021-09-09 ENCOUNTER — Encounter: Payer: Self-pay | Admitting: Cardiology

## 2021-09-09 ENCOUNTER — Ambulatory Visit: Payer: BC Managed Care – PPO | Admitting: Cardiology

## 2021-09-09 ENCOUNTER — Other Ambulatory Visit: Payer: Self-pay

## 2021-09-09 VITALS — BP 140/80 | HR 88 | Ht 70.0 in | Wt 199.2 lb

## 2021-09-09 DIAGNOSIS — R931 Abnormal findings on diagnostic imaging of heart and coronary circulation: Secondary | ICD-10-CM

## 2021-09-09 DIAGNOSIS — E785 Hyperlipidemia, unspecified: Secondary | ICD-10-CM

## 2021-09-09 NOTE — Patient Instructions (Signed)
Medication Instructions:  Your Physician recommend you continue on your current medication as directed.    *If you need a refill on your cardiac medications before your next appointment, please call your pharmacy*   Follow-Up: At [facility], you and your health needs are our priority.  As part of our continuing mission to provide you with exceptional heart care, we have created designated Provider Care Teams.  These Care Teams include your primary Cardiologist (physician) and Advanced Practice Providers (APPs -  Physician Assistants and Nurse Practitioners) who all work together to provide you with the care you need, when you need it.  We recommend signing up for the patient portal called "MyChart".  Sign up information is provided on this After Visit Summary.  MyChart is used to connect with patients for Virtual Visits (Telemedicine).  Patients are able to view lab/test results, encounter notes, upcoming appointments, etc.  Non-urgent messages can be sent to your provider as well.   To learn more about what you can do with MyChart, go to [email or web address].    Your next appointment:   18 months    The format for your next appointment:   In Person  Provider:   Minus Breeding, MD

## 2021-09-10 ENCOUNTER — Encounter: Payer: Self-pay | Admitting: Cardiology

## 2021-09-11 ENCOUNTER — Other Ambulatory Visit: Payer: Self-pay | Admitting: Cardiology

## 2021-09-16 NOTE — Addendum Note (Signed)
Addended by: Dionicio Stall E on: 09/16/2021 12:34 PM   Modules accepted: Orders

## 2021-10-02 DEATH — deceased

## 2024-03-30 ENCOUNTER — Other Ambulatory Visit: Payer: Self-pay | Admitting: Internal Medicine

## 2024-03-30 DIAGNOSIS — N400 Enlarged prostate without lower urinary tract symptoms: Secondary | ICD-10-CM
